# Patient Record
Sex: Female | Born: 1975 | Race: White | Hispanic: No | Marital: Married
Health system: Midwestern US, Community
[De-identification: ages and names within clinical notes are randomized; demographics above are authoritative.]

## PROBLEM LIST (undated history)

## (undated) ENCOUNTER — Inpatient Hospital Stay: Payer: Self-pay

## (undated) DIAGNOSIS — R112 Nausea with vomiting, unspecified: Secondary | ICD-10-CM

## (undated) DIAGNOSIS — R51 Headache: Secondary | ICD-10-CM

## (undated) DIAGNOSIS — Z9889 Other specified postprocedural states: Secondary | ICD-10-CM

## (undated) DIAGNOSIS — N200 Calculus of kidney: Secondary | ICD-10-CM

## (undated) DIAGNOSIS — R519 Headache, unspecified: Secondary | ICD-10-CM

## (undated) DIAGNOSIS — T753XXA Motion sickness, initial encounter: Secondary | ICD-10-CM

## (undated) HISTORY — PX: GALLBLADDER SURGERY: SHX652

## (undated) HISTORY — PX: WISDOM TOOTH EXTRACTION: SHX21

## (undated) HISTORY — DX: Calculus of kidney: N20.0

## (undated) HISTORY — PX: OTHER SURGICAL HISTORY: SHX169

---

## 2005-09-19 ENCOUNTER — Emergency Department: Payer: Self-pay | Admitting: Emergency Medicine

## 2007-12-17 HISTORY — PX: CHOLECYSTECTOMY: SHX55

## 2008-01-31 ENCOUNTER — Inpatient Hospital Stay: Payer: Self-pay | Admitting: Obstetrics & Gynecology

## 2008-02-04 ENCOUNTER — Ambulatory Visit: Payer: Self-pay | Admitting: Pediatrics

## 2008-05-17 ENCOUNTER — Ambulatory Visit: Payer: Self-pay

## 2008-05-17 ENCOUNTER — Inpatient Hospital Stay: Payer: Self-pay | Admitting: General Surgery

## 2009-09-15 ENCOUNTER — Inpatient Hospital Stay: Payer: Self-pay | Admitting: Unknown Physician Specialty

## 2011-08-16 ENCOUNTER — Observation Stay: Payer: Self-pay | Admitting: Obstetrics and Gynecology

## 2011-08-28 ENCOUNTER — Ambulatory Visit: Payer: Self-pay

## 2011-10-20 ENCOUNTER — Inpatient Hospital Stay: Payer: Self-pay

## 2011-12-17 HISTORY — PX: LITHOTRIPSY: SUR834

## 2012-01-22 ENCOUNTER — Ambulatory Visit: Payer: Self-pay | Admitting: Urology

## 2012-01-22 LAB — HCG, QUANTITATIVE, PREGNANCY: Beta Hcg, Quant.: <1 m[IU]/mL — ABNORMAL LOW

## 2012-02-13 ENCOUNTER — Ambulatory Visit: Payer: Self-pay | Admitting: Urology

## 2012-02-13 LAB — HCG, QUANTITATIVE, PREGNANCY: Beta Hcg, Quant.: <1 m[IU]/mL — ABNORMAL LOW

## 2012-02-24 ENCOUNTER — Ambulatory Visit: Payer: Self-pay | Admitting: Urology

## 2013-01-09 ENCOUNTER — Emergency Department: Payer: Self-pay | Admitting: Emergency Medicine

## 2013-01-09 LAB — URINALYSIS, COMPLETE
Bacteria: NONE SEEN
Bilirubin,UR: NEGATIVE
Blood: NEGATIVE
Glucose,UR: NEGATIVE mg/dL (ref 0–75)
Leukocyte Esterase: NEGATIVE
Nitrite: NEGATIVE
Ph: 6 (ref 4.5–8.0)
Protein: 30
RBC,UR: 1 /HPF (ref 0–5)
Squamous Epithelial: 1

## 2013-01-09 LAB — COMPREHENSIVE METABOLIC PANEL
Albumin: 3.4 g/dL (ref 3.4–5.0)
Alkaline Phosphatase: 80 U/L (ref 50–136)
BUN: 15 mg/dL (ref 7–18)
Bilirubin,Total: 0.9 mg/dL (ref 0.2–1.0)
Calcium, Total: 8.4 mg/dL — ABNORMAL LOW (ref 8.5–10.1)
Co2: 20 mmol/L — ABNORMAL LOW (ref 21–32)
Creatinine: 0.55 mg/dL — ABNORMAL LOW (ref 0.60–1.30)
EGFR (African American): >60
EGFR (Non-African Amer.): >60
Glucose: 112 mg/dL — ABNORMAL HIGH (ref 65–99)
Potassium: 3.5 mmol/L (ref 3.5–5.1)
SGOT(AST): 24 U/L (ref 15–37)
SGPT (ALT): 31 U/L (ref 12–78)
Total Protein: 7.5 g/dL (ref 6.4–8.2)

## 2013-01-09 LAB — CBC
HCT: 45.5 % (ref 35.0–47.0)
HGB: 16.1 g/dL — ABNORMAL HIGH (ref 12.0–16.0)
RDW: 12.1 % (ref 11.5–14.5)

## 2013-08-05 ENCOUNTER — Observation Stay: Payer: Self-pay | Admitting: Obstetrics and Gynecology

## 2013-08-06 ENCOUNTER — Inpatient Hospital Stay: Payer: Self-pay

## 2013-08-06 LAB — CBC WITH DIFFERENTIAL/PLATELET
Basophil #: 0.1 10*3/uL (ref 0.0–0.1)
Basophil %: 0.7 %
Eosinophil #: 0.2 10*3/uL (ref 0.0–0.7)
Eosinophil %: 1.5 %
HCT: 37.3 % (ref 35.0–47.0)
HGB: 13.3 g/dL (ref 12.0–16.0)
Lymphocyte #: 2.4 10*3/uL (ref 1.0–3.6)
Lymphocyte %: 16.3 %
MCH: 33.1 pg (ref 26.0–34.0)
MCHC: 35.7 g/dL (ref 32.0–36.0)
Monocyte #: 0.8 x10 3/mm (ref 0.2–0.9)
Monocyte %: 5.5 %
Neutrophil #: 11.4 10*3/uL — ABNORMAL HIGH (ref 1.4–6.5)
Neutrophil %: 76 %
Platelet: 256 10*3/uL (ref 150–440)
WBC: 15 10*3/uL — ABNORMAL HIGH (ref 3.6–11.0)

## 2014-06-09 ENCOUNTER — Ambulatory Visit: Payer: Self-pay | Admitting: Family Medicine

## 2014-10-26 LAB — OB RESULTS CONSOLE GC/CHLAMYDIA
CHLAMYDIA, DNA PROBE: NEGATIVE
GC PROBE AMP, GENITAL: NEGATIVE

## 2014-11-16 ENCOUNTER — Emergency Department: Payer: Self-pay | Admitting: Emergency Medicine

## 2014-11-25 LAB — OB RESULTS CONSOLE ABO/RH: RH TYPE: NEGATIVE

## 2014-11-25 LAB — OB RESULTS CONSOLE HEPATITIS B SURFACE ANTIGEN: Hepatitis B Surface Ag: NEGATIVE

## 2014-11-25 LAB — OB RESULTS CONSOLE PLATELET COUNT: Platelets: 289 10*3/uL

## 2014-11-25 LAB — OB RESULTS CONSOLE HGB/HCT, BLOOD
HCT: 40 %
Hemoglobin: 13.9 g/dL

## 2014-11-25 LAB — OB RESULTS CONSOLE RUBELLA ANTIBODY, IGM: Rubella: IMMUNE

## 2014-11-25 LAB — OB RESULTS CONSOLE VARICELLA ZOSTER ANTIBODY, IGG: Varicella: IMMUNE

## 2014-11-25 LAB — OB RESULTS CONSOLE RPR: RPR: NONREACTIVE

## 2014-11-25 LAB — OB RESULTS CONSOLE HIV ANTIBODY (ROUTINE TESTING): HIV: NONREACTIVE

## 2014-11-25 LAB — OB RESULTS CONSOLE ANTIBODY SCREEN: ANTIBODY SCREEN: NEGATIVE

## 2014-12-14 ENCOUNTER — Encounter: Payer: Self-pay | Admitting: General Surgery

## 2014-12-21 ENCOUNTER — Ambulatory Visit (INDEPENDENT_AMBULATORY_CARE_PROVIDER_SITE_OTHER): Payer: BC Managed Care – PPO | Admitting: General Surgery

## 2014-12-21 ENCOUNTER — Encounter: Payer: Self-pay | Admitting: General Surgery

## 2014-12-21 VITALS — BP 120/78 | HR 86 | Resp 12 | Ht 68.0 in | Wt 146.0 lb

## 2014-12-21 DIAGNOSIS — S2020XA Contusion of thorax, unspecified, initial encounter: Secondary | ICD-10-CM

## 2014-12-21 DIAGNOSIS — S20219A Contusion of unspecified front wall of thorax, initial encounter: Secondary | ICD-10-CM
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
## 2014-12-21 NOTE — Patient Instructions (Addendum)
Advised to use a heating pad for comfort. The patient is aware to call back for any questions or concerns.

## 2014-12-22 DIAGNOSIS — S20219A Contusion of unspecified front wall of thorax, initial encounter: Secondary | ICD-10-CM | POA: Insufficient documentation

## 2014-12-29 ENCOUNTER — Ambulatory Visit: Payer: Self-pay | Admitting: General Surgery

## 2015-02-27 LAB — OB RESULTS CONSOLE HIV ANTIBODY (ROUTINE TESTING): HIV: NONREACTIVE

## 2015-02-27 LAB — OB RESULTS CONSOLE RPR: RPR: NONREACTIVE

## 2015-04-15 ENCOUNTER — Encounter: Payer: Self-pay | Admitting: Obstetrics and Gynecology

## 2015-04-15 ENCOUNTER — Inpatient Hospital Stay
Admission: EM | Admit: 2015-04-15 | Discharge: 2015-04-16 | DRG: 782 | Disposition: A | Discharge status: Home / Self Care | Payer: BC Managed Care – PPO | Attending: Obstetrics & Gynecology | Admitting: Obstetrics & Gynecology

## 2015-04-15 DIAGNOSIS — O4453 Low lying placenta with hemorrhage, third trimester: Secondary | ICD-10-CM | POA: Diagnosis present

## 2015-04-15 DIAGNOSIS — O4413 Placenta previa with hemorrhage, third trimester: Principal | ICD-10-CM | POA: Diagnosis present

## 2015-04-15 DIAGNOSIS — Z3A35 35 weeks gestation of pregnancy: Secondary | ICD-10-CM | POA: Diagnosis present

## 2015-04-15 LAB — FIBRINOGEN: Fibrinogen: 407 mg/dL (ref 210–470)

## 2015-04-15 LAB — CBC WITH DIFFERENTIAL/PLATELET
Basophil #: 0.1 10*3/uL (ref 0.0–0.1)
Basophil %: 0.4 %
EOS ABS: 0.1 10*3/uL (ref 0.0–0.7)
EOS PCT: 0.7 %
HCT: 32.9 % — ABNORMAL LOW (ref 35.0–47.0)
HGB: 11.8 g/dL — ABNORMAL LOW (ref 12.0–16.0)
Lymphocyte #: 2.2 10*3/uL (ref 1.0–3.6)
Lymphocyte %: 11.8 %
MCH: 32.9 pg (ref 26.0–34.0)
MCHC: 35.8 g/dL (ref 32.0–36.0)
MCV: 92 fL (ref 80–100)
Monocyte #: 0.7 x10 3/mm (ref 0.2–0.9)
Monocyte %: 3.8 %
NEUTROS ABS: 15.4 10*3/uL — AB (ref 1.4–6.5)
Neutrophil %: 83.3 %
PLATELETS: 251 10*3/uL (ref 150–440)
RBC: 3.58 10*6/uL — AB (ref 3.80–5.20)
RDW: 12.5 % (ref 11.5–14.5)
WBC: 18.5 10*3/uL — ABNORMAL HIGH (ref 3.6–11.0)

## 2015-04-15 LAB — COMPREHENSIVE METABOLIC PANEL
ALBUMIN: 2.7 g/dL — AB
ALT: 14 U/L
AST: 22 U/L
Alkaline Phosphatase: 89 U/L
Anion Gap: 7 (ref 7–16)
BUN: 9 mg/dL
Bilirubin,Total: 0.4 mg/dL
CALCIUM: 8.7 mg/dL — AB
Chloride: 105 mmol/L
Co2: 24 mmol/L
Creatinine: 0.66 mg/dL
Glucose: 97 mg/dL
Potassium: 3.4 mmol/L — ABNORMAL LOW
SODIUM: 136 mmol/L
TOTAL PROTEIN: 5.9 g/dL — AB

## 2015-04-15 LAB — PROTIME-INR
INR: 1
Prothrombin Time: 13.5 secs

## 2015-04-15 LAB — APTT: Activated PTT: 32.9 secs (ref 23.6–35.9)

## 2015-04-15 MED ORDER — BETAMETHASONE SOD PHOS & ACET 6 (3-3) MG/ML IJ SUSP
12.5000 mg | INTRAMUSCULAR | Status: AC
Start: 2015-04-16 — End: 2015-04-16
  Administered 2015-04-16: 12.5 mg via INTRAMUSCULAR

## 2015-04-15 MED ORDER — OXYTOCIN BOLUS FROM INFUSION
250.0000 mL | Freq: Once | INTRAVENOUS | Status: DC
Start: 2015-04-16 — End: 2015-04-16
  Filled 2015-04-15: qty 500

## 2015-04-15 MED ORDER — LACTATED RINGERS IV BOLUS (SEPSIS)
250.0000 mL | INTRAVENOUS | Status: DC | PRN
  Filled 2015-04-15: qty 250

## 2015-04-15 MED ORDER — LACTATED RINGERS IV BOLUS (SEPSIS)
INTRAVENOUS | Status: DC
  Administered 2015-04-15: 20:00:00 via INTRAVENOUS
  Filled 2015-04-15: qty 250

## 2015-04-15 MED ORDER — OXYTOCIN 40 UNITS IN LACTATED RINGERS INFUSION - SIMPLE MED: 75.0000 mL/h | INTRAVENOUS | Status: DC

## 2015-04-15 MED ORDER — LACTATED RINGERS IV SOLN
INTRAVENOUS | Status: DC | PRN
  Filled 2015-04-15: qty 1000

## 2015-04-15 MED ORDER — CALCIUM CARBONATE ANTACID 500 MG PO CHEW: 1.0000 | CHEWABLE_TABLET | Freq: Three times a day (TID) | ORAL | Status: DC | PRN

## 2015-04-16 ENCOUNTER — Encounter: Payer: Self-pay | Admitting: Anesthesiology

## 2015-04-16 ENCOUNTER — Other Ambulatory Visit: Payer: Self-pay | Admitting: Obstetrics and Gynecology

## 2015-04-16 MED ORDER — BETAMETHASONE SOD PHOS & ACET 6 (3-3) MG/ML IJ SUSP
INTRAMUSCULAR | Status: AC
  Filled 2015-04-16: qty 1

## 2015-04-16 NOTE — Discharge Instructions (Signed)
°  Vaginal Bleeding During Pregnancy, Third Trimester °A small amount of bleeding (spotting) from the vagina is relatively common in pregnancy. Various things can cause bleeding or spotting in pregnancy. Sometimes the bleeding is normal and is not a problem. However, bleeding during the third trimester can also be a sign of something serious for the mother and the baby. Be sure to tell your health care provider about any vaginal bleeding right away.  °Some possible causes of vaginal bleeding during the third trimester include:  °· The placenta may be partially or completely covering the opening to the cervix (placenta previa).   °· The placenta may have separated from the uterus (abruption of the placenta).   °· There may be an infection or growth on the cervix.   °· You may be starting labor, called discharging of the mucus plug.   °· The placenta may grow into the muscle layer of the uterus (placenta accreta).   °HOME CARE INSTRUCTIONS  °Watch your condition for any changes. The following actions may help to lessen any discomfort you are feeling:  °· Follow your health care provider's instructions for limiting your activity. If your health care provider orders bed rest, you may need to stay in bed and only get up to use the bathroom. However, your health care provider may allow you to continue light activity. °· If needed, make plans for someone to help with your regular activities and responsibilities while you are on bed rest. °· Keep track of the number of pads you use each day, how often you change pads, and how soaked (saturated) they are. Write this down. °· Do not use tampons. Do not douche. °· Do not have sexual intercourse or orgasms until approved by your health care provider. °· Follow your health care provider's advice about lifting, driving, and physical activities. °· If you pass any tissue from your vagina, save the tissue so you can show it to your health care provider.   °· Only take  over-the-counter or prescription medicines as directed by your health care provider. °· Do not take aspirin because it can make you bleed.   °· Keep all follow-up appointments as directed by your health care provider. °SEEK MEDICAL CARE IF: °· You have any vaginal bleeding during any part of your pregnancy. °· You have cramps or labor pains. °· You have a fever, not controlled by medicine. °SEEK IMMEDIATE MEDICAL CARE IF:  °· You have severe cramps or pain in your back or belly (abdomen). °· You have chills. °· You have a gush of fluid from the vagina. °· You pass large clots or tissue from your vagina. °· Your bleeding increases. °· You feel light-headed or weak. °· You pass out. °· You feel less movement or no movement of the baby.   °MAKE SURE YOU: °· Understand these instructions. °· Will watch your condition. °· Will get help right away if you are not doing well or get worse. °Document Released: 02/22/2003 Document Revised: 12/07/2013 Document Reviewed: 08/09/2013 °ExitCare® Patient Information ©2015 [employer]. This information is not intended to replace advice given to you by your health care provider. Make sure you discuss any questions you have with your health care provider. ° ° °

## 2015-04-16 NOTE — Discharge Summary (Signed)
Physician Discharge Summary  Patient ID: Cathy CrockMegan Owens MRN: [id number] DOB/AGE: 37/07/1976 37 y.o.  Admit date: 04/15/2015 Discharge date: 04/16/2015  Admission Diagnoses:  Discharge Diagnoses:  Principal Problem:   Low lying placenta with hemorrhage in third trimester, antepartum   Discharged Condition: good  Hospital Course: Patient had episode of bleeding at home, with known low lying placenta, now at 35 weeks.  Admitted for observation, with some spotting but no further active bleeding.  Early contractions on monitor but minimally felt and none on monitor for last several hours.  Stayed overnight with no further bleeding.  Fetal well being reassuring.  Inpatient and outpatient monitoring and surveillance discussed. Plan outpt management with follow up/ ultrasound this week.  Consults: None  Significant Diagnostic Studies:  Results for orders placed or performed during the hospital encounter of 04/15/15 (from the past 48 hour(s))  CBC with Differential/Platelet     Status: Abnormal   Collection Time: 04/15/15  8:04 AM  Result Value Ref Range   WBC 18.5 (H) 3.6-11.0 x10 3/mm 3   RBC 3.58 (L) 3.80-5.20 X10 6/mm 3   HGB 11.8 (L) 12.0-16.0 g/dL   HCT 21.332.9 (L) 08.6-57.835.0-47.0 %   MCV 92 80-100 fL   MCH 32.9 26.0-34.0 pg   MCHC 35.8 32.0-36.0 g/dL   RDW 46.912.5 62.9-52.811.5-14.5 %   Platelet 251 150-440 x10 3/mm 3   Neutrophil % 83.3 %   Lymphocyte % 11.8 %   Monocyte % 3.8 %   Eosinophil % 0.7 %   Basophil % 0.4 %   Neutrophil # 15.4 (H) 1.4-6.5 x10 3/mm 3   Lymphocyte # 2.2 1.0-3.6 x10 3/mm 3   Monocyte # 0.7 0.2-0.9 x10 3/mm    Eosinophil # 0.1 0.0-0.7 x10 3/mm 3   Basophil # 0.1 0.0-0.1 x10 3/mm 3  Fibrinogen     Status: None   Collection Time: 04/15/15  8:04 AM  Result Value Ref Range   Fibrinogen 407 210-470 mg/dL  APTT     Status: None   Collection Time: 04/15/15  8:04 AM  Result Value Ref Range   Activated PTT 32.9 23.6-35.9 secs  Protime-INR     Status: None   Collection Time:  04/15/15  8:04 AM  Result Value Ref Range   Prothrombin Time 13.5 secs   INR 1.0     Discharge Exam: Blood pressure 106/60, pulse 94, temperature 98.2 F (36.8 C), temperature source Oral, resp. rate 20, last menstrual period 08/12/2014. General appearance: alert and cooperative GI: gravid, NT FHT 140s, accels, no decels; no ctxs   Disposition: home     Medication List    TAKE these medications        docusate sodium 100 MG capsule  Commonly known as:  COLACE  Take 100 mg by mouth daily as needed for mild constipation.     hydrocortisone-pramoxine 2.5-1 % rectal cream  Commonly known as:  ANALPRAM-HC  Place 1 application rectally 3 (three) times daily.     ibuprofen 100 MG chewable tablet  Commonly known as:  ADVIL,MOTRIN  Chew 600 mg by mouth every 6 (six) hours as needed for fever or mild pain (As needed, pain, temp. great than 100.4 or inflammation).     NON FORMULARY  - Hydrocortisone-pramoxine 1%-1% topical foam  - 1 application topically 3 to 4 times a day, as needed.     oxyCODONE-acetaminophen 5-325 MG per tablet  Commonly known as:  PERCOCET/ROXICET  Take 1 tablet by mouth every 4 (four) hours as  needed for severe pain.     prenatal multivitamin Tabs tablet  Take 1 tablet by mouth daily at 12 noon. Prenatal Multivitamins with Folic Acid      prenatal vitamin w/FE, FA 27-1 MG Tabs tablet  Take 1 tablet by mouth daily at 12 noon.           Follow-up Information    Follow up with Letitia Libra, MD In 3 days.   Specialty:  Obstetrics and Gynecology   Contact information:   [address] [place] [address] [phone]       Signed: Letitia Libra, MD 04/16/2015, 9:27 AM

## 2015-04-16 NOTE — Anesthesia Preprocedure Evaluation (Deleted)
Anesthesia Evaluation  Patient identified by MRN, date of birth, ID band Patient awake    Reviewed: Allergy & Precautions, NPO status , Patient's Chart, lab work & pertinent test results  Airway Mallampati: II  TM Distance: >3 FB Neck ROM: Full    Dental   Pulmonary          Cardiovascular Rhythm:Regular Rate:Normal     Neuro/Psych    GI/Hepatic   Endo/Other    Renal/GU      Musculoskeletal   Abdominal   Peds  Hematology   Anesthesia Other Findings   Reproductive/Obstetrics                             Anesthesia Physical Anesthesia Plan  ASA: II  Anesthesia Plan: Epidural   Post-op Pain Management:    Induction:   Airway Management Planned:   Additional Equipment:   Intra-op Plan:   Post-operative Plan:   Informed Consent: I have reviewed the patients History and Physical, chart, labs and discussed the procedure including the risks, benefits and alternatives for the proposed anesthesia with the patient or authorized representative who has indicated his/her understanding and acceptance.     Plan Discussed with:   Anesthesia Plan Comments:         Anesthesia Quick Evaluation

## 2015-04-21 ENCOUNTER — Other Ambulatory Visit: Payer: Self-pay | Admitting: Obstetrics and Gynecology

## 2015-04-21 DIAGNOSIS — O4400 Placenta previa specified as without hemorrhage, unspecified trimester: Secondary | ICD-10-CM

## 2015-04-25 NOTE — H&P (Signed)
L&D Evaluation:  History Expanded:  HPI 39 yo G5P4004 at 6158w1d gestational age by LMP consistent with 11 week ultrasound.  Pregnancy complicated by low-lying placenta and advanced maternal age.  She presents for new-onset bleeding that started at about 4 o'clock this morning. The bleeding is described as heavy and she feels occasional contractions.  She had an ultrasound about two weeks ago and it revealed that the placental tip was about 0.5cm from the internal os.  She was scheduled to have a repeat ultrasound this Thursday.  She notes no leakage of fluid, positive fetal movement.  She feels occasional contractions.   Gravida 5   Term 4   PreTerm 0   Abortion 0   Living 4   Blood Type (Maternal) A negative   Group B Strep Results Maternal (Result >5wks must be treated as unknown) negative  unknown/result > 5 weeks ago   Maternal HIV Negative   Maternal Syphilis Ab Nonreactive   Maternal Varicella Immune   Rubella Results (Maternal) immune   Maternal T-Dap Immune   [facility]   Presents with vaginal bleeding   Patient's Medical History migraine, urinary calculus   Patient's Surgical History cholecystectomy, hand surgery, wisdome teeth extraction   Medications Pre Natal Vitamins   Allergies PCN, Sulfa   Social History none   Family History Non-Contributory   Exam:  Vital Signs stable  afebrile, normotensive, all others normal   General no apparent distress   Mental Status clear   Chest clear   Heart normal sinus rhythm   Abdomen gravid, non-tender   Estimated Fetal Weight Large for gestational age   Back no CVAT   Edema no edema   Pelvic red blood on perineum, vaginal vault with minimal red blood. Cervix with no active bleeding.  Cvx 1/25/-4.   Mebranes Intact   FHT normal rate with no decels   FHT Description 140/mod var/+accels/no decels   Ucx regular, 3-4 q 10 min   Skin no lesions   Impression:  Impression 1) Intrauterine  pregnancy at 1858w1d gestational age, 2) elderly multigravida, 3) vaginal bleeding, 4) low lying placenta   Plan:  Comments 1) Labor with low-lying placenta: will monitor closely as the bleeding appears to have slowed or stopped.  Will not administer tocolytics. Will monitor closely as she is contracting.  Unable to perform vaginal-approach ultrasound to assess distance of placenta from internal os.  If able to get stable, may repeat ultrasound.  Discussed that if she requires urgent or emergent cesarean delivery, risk of hemorrhage is high and risk of need for cesarean hysterectomy is elevated.  Ordered basic labs, plus fibrinogin, aptt, pt, cmp, kleihauer-betke.    2) Fetus - category I tracing.  Per recent ALPS trial data and ACOG Practice Advisory (March 20, 2015), will administer BMTZ. Discussed with patient and husband.  3) PNL A negative / ABSC negative / RI / VZI / HIV neg / RPR NR / HBsAg neg / NIFT negative / 1-hr OGTT 117 / GBS unknown / total weight gain this pregnancy = 28lb     - s/p rhogam at 02/27/15  4) TDAP given 03/30/15  5) Disposition - pending outcome of bleeding/labor.   Electronic Signatures: Conard NovakJackson, Stephen D (MD)  (Signed 30-Apr-16 07:57)  Authored: L&D Evaluation   Last Updated: 30-Apr-16 07:57 by Conard NovakJackson, Stephen D (MD)

## 2015-04-28 ENCOUNTER — Inpatient Hospital Stay
Admission: EM | Admit: 2015-04-28 | Discharge: 2015-05-02 | DRG: 765 | Disposition: A | Discharge status: Home / Self Care | Payer: BC Managed Care – PPO | Attending: Obstetrics and Gynecology | Admitting: Obstetrics and Gynecology

## 2015-04-28 ENCOUNTER — Inpatient Hospital Stay: Payer: BC Managed Care – PPO

## 2015-04-28 DIAGNOSIS — O09523 Supervision of elderly multigravida, third trimester: Secondary | ICD-10-CM | POA: Diagnosis not present

## 2015-04-28 DIAGNOSIS — O4413 Placenta previa with hemorrhage, third trimester: Principal | ICD-10-CM | POA: Diagnosis present

## 2015-04-28 DIAGNOSIS — D62 Acute posthemorrhagic anemia: Secondary | ICD-10-CM | POA: Diagnosis not present

## 2015-04-28 DIAGNOSIS — O4453 Low lying placenta with hemorrhage, third trimester: Secondary | ICD-10-CM

## 2015-04-28 DIAGNOSIS — J029 Acute pharyngitis, unspecified: Secondary | ICD-10-CM | POA: Diagnosis not present

## 2015-04-28 DIAGNOSIS — Z3A37 37 weeks gestation of pregnancy: Secondary | ICD-10-CM | POA: Diagnosis present

## 2015-04-28 DIAGNOSIS — O4690 Antepartum hemorrhage, unspecified, unspecified trimester: Secondary | ICD-10-CM | POA: Diagnosis present

## 2015-04-28 DIAGNOSIS — O4420 Partial placenta previa NOS or without hemorrhage, unspecified trimester: Secondary | ICD-10-CM | POA: Diagnosis present

## 2015-04-28 DIAGNOSIS — Z98891 History of uterine scar from previous surgery: Secondary | ICD-10-CM

## 2015-04-28 LAB — CBC
HCT: 35 % (ref 35.0–47.0)
Hemoglobin: 12.2 g/dL (ref 12.0–16.0)
MCH: 32.8 pg (ref 26.0–34.0)
MCHC: 34.7 g/dL (ref 32.0–36.0)
MCV: 94.3 fL (ref 80.0–100.0)
PLATELETS: 280 10*3/uL (ref 150–440)
RBC: 3.71 MIL/uL — ABNORMAL LOW (ref 3.80–5.20)
RDW: 13.1 % (ref 11.5–14.5)
WBC: 17.1 10*3/uL — AB (ref 3.6–11.0)

## 2015-04-28 LAB — DIFFERENTIAL
BASOS ABS: 0.1 10*3/uL (ref 0–0.1)
Basophils Relative: 1 %
Eosinophils Absolute: 0.2 10*3/uL (ref 0–0.7)
Eosinophils Relative: 1 %
LYMPHS ABS: 2.4 10*3/uL (ref 1.0–3.6)
LYMPHS PCT: 14 %
MONO ABS: 0.9 10*3/uL (ref 0.2–0.9)
MONOS PCT: 5 %
NEUTROS ABS: 13.5 10*3/uL — AB (ref 1.4–6.5)
Neutrophils Relative %: 79 %

## 2015-04-28 LAB — ABO/RH: ABO/RH(D): A NEG

## 2015-04-28 MED ORDER — DEXTROSE IN LACTATED RINGERS 5 % IV SOLN
INTRAVENOUS | Status: DC
  Filled 2015-04-28 (×4): qty 1000

## 2015-04-28 MED ORDER — SODIUM CHLORIDE 0.9 % IJ SOLN
INTRAMUSCULAR | Status: AC
  Administered 2015-04-28: 3 mL
  Filled 2015-04-28: qty 3

## 2015-04-28 NOTE — OB Triage Note (Addendum)
Asked to check on patient with Dr. Jean RosenthalJackson. Patient requesting something to eat or drink as she is hungry and getting a headache. Dr. Jean RosenthalJackson D/W patient remain NPO until blood tests are back to R/O abruption vs. LLP bleeding. Order to start IV with D5LR and draw blood. Dr. Jean RosenthalJackson will reevaluate after C/S. Patient agrees with plan. Loyola MastKaren R Saisha Hogue, RN

## 2015-04-28 NOTE — OB Triage Note (Signed)
Pt. presents this evening with reports of irregular abdominal tightening. States she has a low-lying placenta and has noticed bright red blood in toilet, while wiping, and accumulating in a pad. Observed scant brownish discharge yesterday morning.

## 2015-04-28 NOTE — Plan of Care (Addendum)
Dr. Jean RosenthalJackson in to see patient. Discuss lab results and to continue to keep patient for observation. Patient ok with plan, may have clear liquid. She would like to wash her hair with husband's help. Supplies given. Loyola MastKaren R Shandy Vi, RN

## 2015-04-28 NOTE — Plan of Care (Deleted)
Second dose of Cytotec placed in right cheek. Patient continues to rate pain at 6. Closing eyes, breathing and can not talk through the contraction, but relaxes and talks or doses between contractions. Loyola MastKaren R Lidiya Reise, RN

## 2015-04-28 NOTE — OB Triage Note (Addendum)
39yo [id number] @ 37.0 with low lying placenta and history of admission for heavy bleed on 4/30 presents today with bleeding.  She woke from sleep after changing position and felt discharge, noticing it was blood, no more than a period. She did not notice active bleeding.  She noted it more on the toilet paper than on a pad.  Bright red.  No contractions, + fetal movement, no LOF.   Was scheduled for an appointment today, and an US with [facility] next Thursday.   FHT 140 mod +accels no decels TOCO: rare ctx  BP 118/80 mmHg  Pulse 95  Temp(Src) 98.5 F (36.9 C) (Oral)  Resp 16  LMP 08/12/2014  Plan: - Sterile speculum exam to eval bleeding/dilation - ultrasound to eval placenta - SVE pending result of US - if bleeding persists or increases, will consider delivery but route TBD pending US eval. Discussed possibilities with patient and husband, they understand decision making process. - Rh negative sp Rhogam on 02/27/15  Ranae Plumberhelsea Aleksa Collinsworth, MD

## 2015-04-28 NOTE — H&P (Signed)
L&D Evaluation:  History Expanded:  HPI 39 yo G5P4004 at 4157w0d gestational age by LMP consistent with 11 week ultrasound.  Pregnancy complicated by low-lying placenta and advanced maternal age.  She presented this AM with her second episode of vaginal bleeding, this time much less than a menses for her.  She had an ultrasound today that showed the placental tip was about 1cm from the internal os.  She notes no leakage of fluid, positive fetal movement.  She feels no contractions.   Gravida 5   Term 4   PreTerm 0   Abortion 0   Living 4   Blood Type (Maternal) A negative   Group B Strep Results Maternal (Result >5wks must be treated as unknown) negative  unknown/result > 5 weeks ago   Maternal HIV Negative   Maternal Syphilis Ab Nonreactive   Maternal Varicella Immune   Rubella Results (Maternal) immune   Maternal T-Dap Immune   EDC 19-May-2015   Presents with vaginal bleeding   Patient's Medical History migraine, urinary calculus   Patient's Surgical History cholecystectomy, hand surgery, wisdome teeth extraction   Medications Pre Natal Vitamins   Allergies PCN, Sulfa   Social History none   Family History Non-Contributory   Exam:  Vital Signs Filed Vitals:   04/28/15 1535  BP: 116/75  Pulse: 90  Temp:   Resp:       General no apparent distress   Mental Status clear   Chest clear   Heart normal sinus rhythm   Abdomen gravid, non-tender   Estimated Fetal Weight Large for gestational age   Back no CVAT   Edema no edema   Pelvic Examined at presentation by Dr. Elesa MassedWard. Small clot noted in vaginal vault, no active bleeding.   Mebranes Intact   FHT normal rate with no decels   FHT Description FHT 140 mod +accels no decels TOCO: rare ctx      Skin no lesions   Impression:  Impression 1) Intrauterine pregnancy at 7257w0d gestational age, 2) elderly multigravida, 3) vaginal bleeding, 4) low lying placenta   Plan:  Comments 1) Labor with  low-lying placenta: This is her second bleed. She has been monitored all day on labor an delivery and is stable from a bleeding standpoint.  I spoke extensively with the patient and her husband regarding recommendation for delivery by cesarean section given the location of her placenta and two episodes of bleeding.  Discussed risk of postpartum hemorrhage. Discussed possible risk of placenta accreta in this situation and risk of need for hysterectomy at the time of delivery.  She and her husband voiced understanding and agreement with this plan.  Will plan for cesarean delivery for 7am tomorrow morning.  OR is aware and she has been posted.  Will order type and hold of 4 units pRBCs given her high risk of postpartum hemorrhage (multiparity, marginal placenta, risk of accreta)   2) Fetus - category I tracing.  S/p betamethasone at 35 weeks.   3) PNL A negative / ABSC negative / RI / VZI / HIV neg / RPR NR / HBsAg neg / NIFT negative / 1-hr OGTT 117 / GBS unknown / total weight gain this pregnancy = 28lb     - s/p rhogam at 02/27/15  4) TDAP given 03/30/15  5) Disposition - home postpartum.   Electronic Signatures: Conard NovakJackson, Aracelis Ulrey D (MD)  (Signed 30-Apr-16 07:57)  Authored: L&D Evaluation   Last Updated: 30-Apr-16 07:57 by Conard NovakJackson, Nanami Whitelaw D (MD)

## 2015-04-29 ENCOUNTER — Encounter: Payer: Self-pay | Admitting: *Deleted

## 2015-04-29 ENCOUNTER — Inpatient Hospital Stay: Payer: BC Managed Care – PPO | Admitting: Registered Nurse

## 2015-04-29 ENCOUNTER — Encounter
Admission: EM | Disposition: A | Discharge status: Home / Self Care | Payer: Self-pay | Source: Home / Self Care | Attending: Obstetrics and Gynecology

## 2015-04-29 DIAGNOSIS — Z98891 History of uterine scar from previous surgery: Secondary | ICD-10-CM

## 2015-04-29 LAB — KLEIHAUER-BETKE STAIN
# Vials RhIg: 1
FETAL CELLS %: 0 %
QUANTITATION FETAL HEMOGLOBIN: 0 mL

## 2015-04-29 LAB — PREPARE RBC (CROSSMATCH)

## 2015-04-29 LAB — RPR: RPR Ser Ql: NONREACTIVE

## 2015-04-29 SURGERY — Surgical Case
Anesthesia: General

## 2015-04-29 MED ORDER — SUCCINYLCHOLINE CHLORIDE 20 MG/ML IJ SOLN
INTRAMUSCULAR | Status: DC | PRN
  Administered 2015-04-29: 100 mg via INTRAVENOUS

## 2015-04-29 MED ORDER — AMMONIA AROMATIC IN INHA
RESPIRATORY_TRACT | Status: AC
  Filled 2015-04-29: qty 10

## 2015-04-29 MED ORDER — ROCURONIUM BROMIDE 100 MG/10ML IV SOLN
INTRAVENOUS | Status: DC | PRN
  Administered 2015-04-29: 10 mg via INTRAVENOUS
  Administered 2015-04-29: 20 mg via INTRAVENOUS

## 2015-04-29 MED ORDER — ZOLPIDEM TARTRATE 5 MG PO TABS: 5.0000 mg | ORAL_TABLET | Freq: Every evening | ORAL | Status: DC | PRN

## 2015-04-29 MED ORDER — DIBUCAINE 1 % RE OINT: 1.0000 "application " | TOPICAL_OINTMENT | RECTAL | Status: DC | PRN

## 2015-04-29 MED ORDER — NALOXONE HCL 0.4 MG/ML IJ SOLN: 0.4000 mg | INTRAMUSCULAR | Status: DC | PRN

## 2015-04-29 MED ORDER — CLINDAMYCIN PHOSPHATE 900 MG/50ML IV SOLN
INTRAVENOUS | Status: AC
  Administered 2015-04-29: 900 mg via INTRAVENOUS
  Filled 2015-04-29: qty 50

## 2015-04-29 MED ORDER — OXYTOCIN 40 UNITS IN LACTATED RINGERS INFUSION - SIMPLE MED
INTRAVENOUS | Status: DC | PRN
  Administered 2015-04-29: 40 mL via INTRAVENOUS

## 2015-04-29 MED ORDER — OXYTOCIN 40 UNITS IN LACTATED RINGERS INFUSION - SIMPLE MED
INTRAVENOUS | Status: AC
  Administered 2015-04-29: 40 [IU] via INTRAVENOUS
  Filled 2015-04-29: qty 1000

## 2015-04-29 MED ORDER — SENNOSIDES-DOCUSATE SODIUM 8.6-50 MG PO TABS
2.0000 | ORAL_TABLET | ORAL | Status: DC
  Administered 2015-04-29 – 2015-05-02 (×3): 2 via ORAL
  Filled 2015-04-29 (×3): qty 2

## 2015-04-29 MED ORDER — LIDOCAINE HCL (PF) 1 % IJ SOLN
INTRAMUSCULAR | Status: AC
  Filled 2015-04-29: qty 30

## 2015-04-29 MED ORDER — BUPIVACAINE HCL (PF) 0.5 % IJ SOLN
INTRAMUSCULAR | Status: AC
  Filled 2015-04-29: qty 30

## 2015-04-29 MED ORDER — DIPHENHYDRAMINE HCL 12.5 MG/5ML PO ELIX
12.5000 mg | ORAL_SOLUTION | Freq: Four times a day (QID) | ORAL | Status: DC | PRN
  Filled 2015-04-29: qty 5

## 2015-04-29 MED ORDER — PRENATAL MULTIVITAMIN CH
1.0000 | ORAL_TABLET | Freq: Every day | ORAL | Status: DC
  Administered 2015-04-30 – 2015-05-02 (×3): 1 via ORAL
  Filled 2015-04-29 (×3): qty 1

## 2015-04-29 MED ORDER — FENTANYL CITRATE (PF) 100 MCG/2ML IJ SOLN
INTRAMUSCULAR | Status: AC
  Administered 2015-04-29: 25 ug via INTRAVENOUS
  Filled 2015-04-29: qty 2

## 2015-04-29 MED ORDER — BUPIVACAINE ON-Q PAIN PUMP (FOR ORDER SET NO CHG)
INJECTION | Status: AC
  Filled 2015-04-29: qty 1

## 2015-04-29 MED ORDER — MISOPROSTOL 200 MCG PO TABS
ORAL_TABLET | ORAL | Status: AC
  Filled 2015-04-29: qty 4

## 2015-04-29 MED ORDER — ONDANSETRON HCL 4 MG/2ML IJ SOLN: 4.0000 mg | Freq: Four times a day (QID) | INTRAMUSCULAR | Status: DC | PRN

## 2015-04-29 MED ORDER — SODIUM CHLORIDE 0.9 % IV SOLN: Freq: Once | INTRAVENOUS | Status: DC

## 2015-04-29 MED ORDER — ONDANSETRON HCL 4 MG/2ML IJ SOLN
INTRAMUSCULAR | Status: DC | PRN
  Administered 2015-04-29: 4 mg via INTRAVENOUS

## 2015-04-29 MED ORDER — CITRIC ACID-SODIUM CITRATE 334-500 MG/5ML PO SOLN
30.0000 mL | Freq: Once | ORAL | Status: AC
  Administered 2015-04-29: 30 mL via ORAL

## 2015-04-29 MED ORDER — BUPIVACAINE HCL 0.5 % IJ SOLN
5.0000 mL | Freq: Once | INTRAMUSCULAR | Status: DC
  Filled 2015-04-29: qty 5

## 2015-04-29 MED ORDER — SODIUM CHLORIDE 0.9 % IJ SOLN: 9.0000 mL | INTRAMUSCULAR | Status: DC | PRN

## 2015-04-29 MED ORDER — OXYTOCIN 40 UNITS IN LACTATED RINGERS INFUSION - SIMPLE MED
62.5000 mL/h | INTRAVENOUS | Status: AC
  Administered 2015-04-29: 40 [IU] via INTRAVENOUS
  Filled 2015-04-29: qty 1000

## 2015-04-29 MED ORDER — FENTANYL CITRATE (PF) 100 MCG/2ML IJ SOLN
INTRAMUSCULAR | Status: AC
  Filled 2015-04-29: qty 2

## 2015-04-29 MED ORDER — FENTANYL CITRATE (PF) 100 MCG/2ML IJ SOLN
INTRAMUSCULAR | Status: DC | PRN
  Administered 2015-04-29: 150 ug via INTRAVENOUS
  Administered 2015-04-29: 100 ug via INTRAVENOUS
  Administered 2015-04-29: 100 ug

## 2015-04-29 MED ORDER — PROPOFOL 10 MG/ML IV BOLUS
INTRAVENOUS | Status: DC | PRN
  Administered 2015-04-29: 160 mg via INTRAVENOUS
  Administered 2015-04-29 (×2): 100 mg via INTRAVENOUS

## 2015-04-29 MED ORDER — LACTATED RINGERS IV BOLUS (SEPSIS)
1000.0000 mL | Freq: Once | INTRAVENOUS | Status: AC
  Administered 2015-04-29: 1000 mL via INTRAVENOUS

## 2015-04-29 MED ORDER — OXYTOCIN 10 UNIT/ML IJ SOLN
INTRAMUSCULAR | Status: AC
  Filled 2015-04-29: qty 2

## 2015-04-29 MED ORDER — WITCH HAZEL-GLYCERIN EX PADS: 1.0000 "application " | MEDICATED_PAD | CUTANEOUS | Status: DC | PRN

## 2015-04-29 MED ORDER — BUPIVACAINE HCL 0.5 % IJ SOLN
INTRAMUSCULAR | Status: DC | PRN
Start: 2015-04-29 — End: 2015-04-29
  Administered 2015-04-29: 10 mL

## 2015-04-29 MED ORDER — MORPHINE SULFATE (PF) 1 MG/ML IV SOLN
INTRAVENOUS | Status: DC
  Administered 2015-04-29: 14:00:00 via INTRAVENOUS
  Administered 2015-04-29: 1 mg via INTRAVENOUS
  Filled 2015-04-29 (×2): qty 25

## 2015-04-29 MED ORDER — BUPIVACAINE 0.25 % ON-Q PUMP DUAL CATH 400 ML
INJECTION | Status: AC
  Filled 2015-04-29: qty 400

## 2015-04-29 MED ORDER — ONDANSETRON HCL 4 MG/2ML IJ SOLN: 4.0000 mg | Freq: Once | INTRAMUSCULAR | Status: DC | PRN

## 2015-04-29 MED ORDER — LACTATED RINGERS IV SOLN
INTRAVENOUS | Status: DC
  Administered 2015-04-30: 11:00:00 via INTRAVENOUS

## 2015-04-29 MED ORDER — DIPHENHYDRAMINE HCL 50 MG/ML IJ SOLN: 12.5000 mg | Freq: Four times a day (QID) | INTRAMUSCULAR | Status: DC | PRN

## 2015-04-29 MED ORDER — KETOROLAC TROMETHAMINE 30 MG/ML IJ SOLN
30.0000 mg | Freq: Four times a day (QID) | INTRAMUSCULAR | Status: DC | PRN
  Administered 2015-04-29 – 2015-04-30 (×3): 30 mg via INTRAVENOUS
  Filled 2015-04-29 (×2): qty 1

## 2015-04-29 MED ORDER — CITRIC ACID-SODIUM CITRATE 334-500 MG/5ML PO SOLN
ORAL | Status: AC
  Administered 2015-04-29: 30 mL via ORAL
  Filled 2015-04-29: qty 15

## 2015-04-29 MED ORDER — CLINDAMYCIN PHOSPHATE 900 MG/50ML IV SOLN
900.0000 mg | INTRAVENOUS | Status: AC
  Administered 2015-04-29: 900 mg via INTRAVENOUS
  Filled 2015-04-29: qty 50

## 2015-04-29 MED ORDER — FENTANYL CITRATE (PF) 100 MCG/2ML IJ SOLN
25.0000 ug | INTRAMUSCULAR | Status: AC | PRN
  Administered 2015-04-29 (×4): 25 ug via INTRAVENOUS

## 2015-04-29 MED ORDER — LACTATED RINGERS IV SOLN
INTRAVENOUS | Status: DC | PRN
  Administered 2015-04-29: 08:00:00 via INTRAVENOUS

## 2015-04-29 MED ORDER — NEOSTIGMINE METHYLSULFATE 10 MG/10ML IV SOLN
INTRAVENOUS | Status: DC | PRN
  Administered 2015-04-29: 3 mg via INTRAVENOUS

## 2015-04-29 MED ORDER — LANOLIN HYDROUS EX OINT: 1.0000 "application " | TOPICAL_OINTMENT | CUTANEOUS | Status: DC | PRN

## 2015-04-29 MED ORDER — SIMETHICONE 80 MG PO CHEW
80.0000 mg | CHEWABLE_TABLET | Freq: Three times a day (TID) | ORAL | Status: DC
  Administered 2015-04-30 – 2015-05-02 (×7): 80 mg via ORAL
  Filled 2015-04-29 (×7): qty 1

## 2015-04-29 MED ORDER — MENTHOL 3 MG MT LOZG
1.0000 | LOZENGE | OROMUCOSAL | Status: DC | PRN
  Filled 2015-04-29: qty 9

## 2015-04-29 MED ORDER — ACETAMINOPHEN 325 MG PO TABS: 650.0000 mg | ORAL_TABLET | ORAL | Status: DC | PRN

## 2015-04-29 MED ORDER — KETOROLAC TROMETHAMINE 30 MG/ML IJ SOLN
INTRAMUSCULAR | Status: AC
  Administered 2015-04-29: 30 mg via INTRAVENOUS
  Filled 2015-04-29: qty 1

## 2015-04-29 MED ORDER — GENTAMICIN SULFATE 40 MG/ML IJ SOLN
1.5000 mg/kg | INTRAVENOUS | Status: AC
  Administered 2015-04-29: 100 mg via INTRAVENOUS
  Filled 2015-04-29: qty 2.5

## 2015-04-29 MED ORDER — GLYCOPYRROLATE 0.2 MG/ML IJ SOLN
INTRAMUSCULAR | Status: DC | PRN
  Administered 2015-04-29: 0.6 mg via INTRAVENOUS

## 2015-04-29 MED ORDER — BUPIVACAINE 0.25 % ON-Q PUMP DUAL CATH 400 ML: 400.0000 mL | INJECTION | Status: DC

## 2015-04-29 SURGICAL SUPPLY — 29 items
CANISTER SUCT 3000ML (MISCELLANEOUS) ×3 IMPLANT
CATH KIT ON-Q SILVERSOAK 5IN (CATHETERS) ×6 IMPLANT
CLOSURE WOUND 1/2 X4 (GAUZE/BANDAGES/DRESSINGS) ×1
DRSG TELFA 3X8 NADH (GAUZE/BANDAGES/DRESSINGS) ×3 IMPLANT
ELECT CAUTERY BLADE 6.4 (BLADE) ×3 IMPLANT
GAUZE SPONGE 4X4 12PLY STRL (GAUZE/BANDAGES/DRESSINGS) ×3 IMPLANT
GLOVE BIO SURGEON STRL SZ7 (GLOVE) ×12 IMPLANT
GLOVE INDICATOR 7.5 STRL GRN (GLOVE) ×3 IMPLANT
GOWN STRL REUS W/ TWL LRG LVL3 (GOWN DISPOSABLE) ×3 IMPLANT
GOWN STRL REUS W/TWL LRG LVL3 (GOWN DISPOSABLE) ×6
HEMOSTAT ARISTA ABSORB 3G PWDR (MISCELLANEOUS) ×3 IMPLANT
LIQUID BAND (GAUZE/BANDAGES/DRESSINGS) ×6 IMPLANT
NS IRRIG 1000ML POUR BTL (IV SOLUTION) ×3 IMPLANT
PACK C SECTION AR (MISCELLANEOUS) ×3 IMPLANT
PAD GROUND ADULT SPLIT (MISCELLANEOUS) ×3 IMPLANT
PAD OB MATERNITY 4.3X12.25 (PERSONAL CARE ITEMS) ×6 IMPLANT
PAD PREP 24X41 OB/GYN DISP (PERSONAL CARE ITEMS) ×3 IMPLANT
SPONGE LAP 18X18 5 PK (GAUZE/BANDAGES/DRESSINGS) ×9 IMPLANT
STRIP CLOSURE SKIN 1/2X4 (GAUZE/BANDAGES/DRESSINGS) ×2 IMPLANT
SUT CHROMIC GUT BROWN 0 54 (SUTURE) ×1 IMPLANT
SUT CHROMIC GUT BROWN 0 54IN (SUTURE) ×3
SUT MNCRL 4-0 (SUTURE)
SUT MNCRL 4-0 27XMFL (SUTURE)
SUT MNCRL AB 4-0 PS2 18 (SUTURE) ×3 IMPLANT
SUT PDS AB 1 TP1 96 (SUTURE) ×3 IMPLANT
SUT PLAIN 2 0 XLH (SUTURE) IMPLANT
SUT VIC AB 0 CT1 36 (SUTURE) ×9 IMPLANT
SUTURE MNCRL 4-0 27XMF (SUTURE) IMPLANT
SWABSTK COMLB BENZOIN TINCTURE (MISCELLANEOUS) ×3 IMPLANT

## 2015-04-29 NOTE — Brief Op Note (Signed)
04/28/2015 - 04/29/2015  9:26 AM  PATIENT:  Cathy Owens  39 y.o. female  PRE-OPERATIVE DIAGNOSIS:  Ceasearn section  POST-OPERATIVE DIAGNOSIS:  Same  PROCEDURE:  Procedure(s): CESAREAN SECTION (N/A)  SURGEON:  Surgeon(s) and Role:    *  Vena AustriaAndreas Diona Peregoy, MD - Primary    * Conard NovakStephen D Jackson MD, Assisting  PHYSICIAN ASSISTANT:   ASSISTANTS: Thomasene MohairJackson, Stephen MD   ANESTHESIA:   general  EBL:  Total I/O In: -  Out: 800 [Blood:800]  BLOOD ADMINISTERED:none  DRAINS: On-Q and foley catheter    LOCAL MEDICATIONS USED:  BUPIVICAINE   SPECIMEN:  Source of Specimen:  placenta  DISPOSITION OF SPECIMEN:  PATHOLOGY  COUNTS:  YES  TOURNIQUET:  * No tourniquets in log *  DICTATION: .Note written in EPIC  PLAN OF CARE: Admit to inpatient   PATIENT DISPOSITION:  PACU - hemodynamically stable.   Delay start of Pharmacological VTE agent (>24hrs) due to surgical blood loss or risk of bleeding: no

## 2015-04-29 NOTE — Op Note (Signed)
Preoperative Diagnosis: 1) 39 y.o. [id number] at 9366w1d 2)Low lying placenta/marginal previa 3) Second episode of vaginal  hemorrhage  Postoperative Diagnosis: 1) 39 y.o. [id number] at 3966w1d 2)Low lying placenta/marginal previa 3) Second episode of vaginal hemorrhage  Operation Performed: Primary low transverse C-section via pfannenstiel skin incision  Indication: Marginal placenta previa second hemorrhage at >[redacted] weeks gestation  Anesthesia: General  Primary Surgeon: Vena AustriaAndreas Laurenashley Viar, MD  Assistant: Thomasene MohairStephen Jackson, MD  Preoperative Antibiotics: Gentamycin & clindamycin  Estimated Blood Loss: 800mL  IV Fluids: 1500mL   Urine Output:: ~2350mL  Drains or Tubes: Foley to gravity drainage, ON-Q catheter system  Implants: none  Specimens Removed: none  Complications: none  Intraoperative Findings:  Normal tubes ovaries and uterus.  Delivery resulted in the birth of a liveborn female , APGAR (1 MIN): 8   APGAR (5 MINS): 8   APGAR (10 MINS):  , weight  pending  Patient Condition: stable  Procedure in Detail:  Patient was taken to the operating room were she was positioned in the supine position, prepped and draped in the usual sterile fashion.  Prior to proceeding with the case a time out was performed and general anesthesia was induced.  Utilizing the scalpel a pfannenstiel skin incision was made 2cm above the pubic symphysis and carried down sharply to the the level of the rectus fascia.  The fascia was incised in the midline using the scalpel and then extended using blunt dissection via Claretha CooperJoel Cohen technique.    The midline was identified, the peritoneum was entered bluntly and expanded using manual tractions.  The uterus was noted to be in a none rotated position.  Next the bladder blade was placed retracting the bladder caudally. The uterus was noted to be in a non-rotated position.  A bladder flap was created. The bladder reflection was grasped with a pickup, and Metzenbaum scissors  were then used the undermine the bladder reflection.  The bladder flap was developed using digital dissection.  The bladder blade was replaced retracting the bladder caudally out of the operative field.  A low transverse incision was scored on the lower uterine segment.  The hysterotomy was entered bluntly using the operators finger.  The hysterotomy incision was extended using manual traction.  The lower lobe of the placenta was at the hysterotomy incision but was able to be elevated out of the way easily.  Amniotomy was performed noting clear fluid.The operators hand was placed within the hysterotomy position noting the fetus to be within the OA position.  The vertex was grasped, flexed, brought to the incision, and delivered a traumatically using fundal pressure.  The remainder of the body delivered with ease.  The infant was suctioned, cord was clamped and cut before handing off to the awaiting neonatologist.  The placenta was delivered using manual extraction with a smooth utero placental interface noted and no difficulty in detaching the placenta.  The uterus was exteriorized, wiped clean of clots and debris using two moist laps.  The right aspect of the uterus had prominent arterial vasculature which was intact but tied off with two O'Leary stitches of 0 Vicryl.  The hysterotomy was closed using a two layer closure of 0 Vicryl, with the first being a running locked, the second a vertical imbricating.   There was a small hematoma on the right lateral  superior aspect of the incision.  A large CTX needle 0 Vicryl was used to place a third O'Leary stitch superior the hematoma. The uterus was returned to the  abdomen.  The peritoneal gutters were wiped clean of clots and debris using two moist laps.  The hysterotomy incision was re-inspected noted to be hemostatic.  The rectus muscles were re-approximated in the midline using a single 2-0 Vicryl mattress stitch.  The rectus muscles were inspected noted to be  hemostatic.  The superior border of the rectus fascia was grasped with a Kocher clamp.  The ON-Q trocars were then placed 4cm above the superior border of the incision and tunneled subfascially.  The introducers were removed and the catheters were threaded through the sleeves after which the sleeves were removed.  The fascia was closed using a looped #1 PDS in a running fashion taking 1cm by 1cm bites.  The subcutaneous tissue was irrigated using warm saline, hemostasis achieved using the bovis.  The subcutaneous dead space was less 3cm and was not closed.  The skin was closed using 4-0 Monocryl in a subcuticular fashion.  Sponge needle and instrument counts were corrects times two.  The patient tolerated the procedure well and was taken to the recovery room in stable condition.

## 2015-04-29 NOTE — Anesthesia Procedure Notes (Signed)
Procedure Name: Intubation Performed by: Zaynah Chawla Pre-anesthesia Checklist: Patient identified, Emergency Drugs available, Suction available, Patient being monitored and Timeout performed Patient Re-evaluated:Patient Re-evaluated prior to inductionOxygen Delivery Method: Circle system utilized Preoxygenation: Pre-oxygenation with 100% oxygen Intubation Type: Cricoid Pressure applied, Rapid sequence and IV induction Laryngoscope Size: Mac and 4 Grade View: Grade I Tube type: Oral Tube size: 7.0 mm Number of attempts: 1 Airway Equipment and Method: Stylet Placement Confirmation: ETT inserted through vocal cords under direct vision,  positive ETCO2,  CO2 detector and breath sounds checked- equal and bilateral Secured at: 21 cm Tube secured with: Tape Comments: Pt had chipped front tooth prior to intubation

## 2015-04-29 NOTE — Anesthesia Postprocedure Evaluation (Signed)
  Anesthesia Post-op Note  Patient: Cathy Owens  Procedure(s) Performed: Procedure(s): CESAREAN SECTION (N/A)  Anesthesia type:General  Patient location: PACU  Post pain: Pain level controlled  Post assessment: Post-op Vital signs reviewed, Patient's Cardiovascular Status Stable, Respiratory Function Stable, Patent Airway and No signs of Nausea or vomiting  Post vital signs: Reviewed and stable  Last Vitals:  Filed Vitals:   04/29/15 0937  BP: 153/90  Pulse: 89  Temp: 37.4 C  Resp:     Level of consciousness: awake, alert  and patient cooperative  Complications: No apparent anesthesia complications

## 2015-04-29 NOTE — Lactation Note (Addendum)
This note was copied from the chart of Boy Cathy Owens. Assisted mom with positioning with pillows on both breasts in biological modified cross cradle hold.  Clicking sound noted towards end of breast feed.  Short, tight frenulum noted with heart-shaped tongue which could be causing clicking sound.  Mom denies nipple pain.

## 2015-04-29 NOTE — Anesthesia Postprocedure Evaluation (Signed)
  Anesthesia Post-op Note  Patient: Cathy Owens  Procedure(s) Performed: Procedure(s): CESAREAN SECTION (N/A)  Anesthesia type:General  Patient location: PACU  Post pain: Pain level controlled  Post assessment: Post-op Vital signs reviewed, Patient's Cardiovascular Status Stable, Respiratory Function Stable, Patent Airway and No signs of Nausea or vomiting  Post vital signs: Reviewed and stable  Last Vitals:  Filed Vitals:   04/29/15 0937  BP: 153/90  Pulse: 89  Temp: 37.4 C  Resp:     Level of consciousness: awake, alert  and patient cooperative  Complications: No apparent anesthesia complications  

## 2015-04-29 NOTE — Anesthesia Preprocedure Evaluation (Signed)
Anesthesia Evaluation  Patient identified by MRN, date of birth, ID band Patient awake    History of Anesthesia Complications (+) PONV  Airway Mallampati: I       Dental   Pulmonary neg pulmonary ROS,    Pulmonary exam normal       Cardiovascular negative cardio ROS Normal cardiovascular exam    Neuro/Psych Anxiety negative neurological ROS     GI/Hepatic negative GI ROS, Neg liver ROS,   Endo/Other  negative endocrine ROS  Renal/GU Renal disease  negative genitourinary   Musculoskeletal negative musculoskeletal ROS (+)   Abdominal Normal abdominal exam  (+)   Peds negative pediatric ROS (+)  Hematology negative hematology ROS (+)   Anesthesia Other Findings   Reproductive/Obstetrics negative OB ROS                             Anesthesia Physical Anesthesia Plan  ASA: II  Anesthesia Plan: General   Post-op Pain Management:    Induction: Intravenous  Airway Management Planned: Oral ETT  Additional Equipment:   Intra-op Plan:   Post-operative Plan: Extubation in OR  Informed Consent: I have reviewed the patients History and Physical, chart, labs and discussed the procedure including the risks, benefits and alternatives for the proposed anesthesia with the patient or authorized representative who has indicated his/her understanding and acceptance.     Plan Discussed with: CRNA and Surgeon  Anesthesia Plan Comments:         Anesthesia Quick Evaluation

## 2015-04-29 NOTE — Anesthesia Postprocedure Evaluation (Signed)
  Anesthesia Post-op Note  Patient: Cathy Owens  Procedure(s) Performed: Procedure(s): CESAREAN SECTION (N/A)  Anesthesia type:General  Patient location: PACU  Post pain: Pain level controlled  Post assessment: Post-op Vital signs reviewed, Patient's Cardiovascular Status Stable, Respiratory Function Stable, Patent Airway and No signs of Nausea or vomiting  Post vital signs: Reviewed and stable  Last Vitals:  Filed Vitals:   04/29/15 0535  BP:   Pulse:   Temp: 36.6 C  Resp: 18    Level of consciousness: awake, alert  and patient cooperative  Complications: No apparent anesthesia complications

## 2015-04-29 NOTE — Transfer of Care (Signed)
Immediate Anesthesia Transfer of Care Note  Patient: Cathy CrockMegan Stiefel  Procedure(s) Performed: Procedure(s): CESAREAN SECTION (N/A)  Patient Location: PACU  Anesthesia Type:General  Level of Consciousness: awake, alert  and oriented  Airway & Oxygen Therapy: Patient Spontanous Breathing  Post-op Assessment: Report given to RN  Post vital signs: stable  Last Vitals:  Filed Vitals:   04/29/15 0937  BP: 153/90  Pulse: 89  Temp: 37.4 C  Resp:     Complications: No apparent anesthesia complications

## 2015-04-30 ENCOUNTER — Encounter: Payer: Self-pay | Admitting: Obstetrics and Gynecology

## 2015-04-30 LAB — CBC
HCT: 26 % — ABNORMAL LOW (ref 35.0–47.0)
Hemoglobin: 9.2 g/dL — ABNORMAL LOW (ref 12.0–16.0)
MCH: 33.4 pg (ref 26.0–34.0)
MCHC: 35.5 g/dL (ref 32.0–36.0)
MCV: 94 fL (ref 80.0–100.0)
PLATELETS: 207 10*3/uL (ref 150–440)
RBC: 2.76 MIL/uL — ABNORMAL LOW (ref 3.80–5.20)
RDW: 13.1 % (ref 11.5–14.5)
WBC: 19.8 10*3/uL — ABNORMAL HIGH (ref 3.6–11.0)

## 2015-04-30 MED ORDER — IBUPROFEN 600 MG PO TABS
600.0000 mg | ORAL_TABLET | Freq: Four times a day (QID) | ORAL | Status: DC | PRN
  Administered 2015-04-30 – 2015-05-02 (×9): 600 mg via ORAL
  Filled 2015-04-30 (×10): qty 1

## 2015-04-30 MED ORDER — OXYCODONE-ACETAMINOPHEN 5-325 MG PO TABS
1.0000 | ORAL_TABLET | ORAL | Status: DC | PRN
  Administered 2015-04-30 – 2015-05-02 (×13): 1 via ORAL
  Filled 2015-04-30: qty 1
  Filled 2015-04-30: qty 2
  Filled 2015-04-30 (×11): qty 1

## 2015-04-30 NOTE — Progress Notes (Signed)
PCA reading 17 mg infused; RR 28; EtCo2 28

## 2015-04-30 NOTE — Progress Notes (Signed)
PCA total infused = 17; Etco2 = 26; RR = 22

## 2015-04-30 NOTE — Progress Notes (Signed)
Subjective:  Doing well, pain well controlled on morphine PCA.  No nausea or emesis.  Lochia moderate   Objective:  Blood pressure 99/63, pulse 73, temperature 97.5 F (36.4 C), temperature source Oral, resp. rate 18, height 5\' 8"  (1.727 m), weight 72.576 kg (160 lb), last menstrual period 07/08/2014, SpO2 96 %, unknown if currently breastfeeding.  Intake/Output Summary (Last 24 hours) at 04/30/15 0959 Last data filed at 04/30/15 0700  Gross per 24 hour  Intake 3801.98 ml  Output   1550 ml  Net 2251.98 ml    General: NAD Pulmonary: no increased work of breathing Abdomen: non-distended, non-tender, fundus firm at level of umbilicus Incision: dressing D/C/I Extremities: no edema, no erythema, no tenderness  Results for orders placed or performed during the hospital encounter of 04/28/15 (from the past 72 hour(s))  RPR     Status: None   Collection Time: 04/28/15  1:29 PM  Result Value Ref Range   RPR Ser Ql Non Reactive Non Reactive    Comment: (NOTE) Performed At: [facility] [address] [place], [place] [id number] Mila HomerHancock William F MD JY:[id number]:[phone]   CBC     Status: Abnormal   Collection Time: 04/28/15  1:29 PM  Result Value Ref Range   WBC 17.1 (H) 3.6 - 11.0 K/uL   RBC 3.71 (L) 3.80 - 5.20 MIL/uL   Hemoglobin 12.2 12.0 - 16.0 g/dL   HCT 86.535.0 78.435.0 - 69.647.0 %   MCV 94.3 80.0 - 100.0 fL   MCH 32.8 26.0 - 34.0 pg   MCHC 34.7 32.0 - 36.0 g/dL   RDW 29.513.1 28.411.5 - 13.214.5 %   Platelets 280 150 - 440 K/uL  Differential     Status: Abnormal   Collection Time: 04/28/15  1:29 PM  Result Value Ref Range   Neutrophils Relative % 79 %   Neutro Abs 13.5 (H) 1.4 - 6.5 K/uL   Lymphocytes Relative 14 %   Lymphs Abs 2.4 1.0 - 3.6 K/uL   Monocytes Relative 5 %   Monocytes Absolute 0.9 0.2 - 0.9 K/uL   Eosinophils Relative 1 %   Eosinophils Absolute 0.2 0 - 0.7 K/uL   Basophils Relative 1 %   Basophils Absolute 0.1 0 - 0.1 K/uL  Kleihauer-Betke stain     Status: None   Collection Time: 04/28/15  1:29 PM  Result Value Ref Range   Fetal Cells % 0.0 %   Quantitation Fetal Hemoglobin 0.0 mL   # Vials RhIg 1   Type and screen     Status: None (Preliminary result)   Collection Time: 04/28/15  1:41 PM  Result Value Ref Range   ABO/RH(D) A NEG    Antibody Screen POS    Sample Expiration 05/01/2015    Antibody Identification PASSIVELY ACQUIRED ANTI-D    Unit Number [id number]    Blood Component Type RBC, LR IRR    Unit division 00    Status of Unit ALLOCATED    Transfusion Status OK TO TRANSFUSE    Crossmatch Result COMPATIBLE    Unit Number [id number]    Blood Component Type RBC, LR IRR    Unit division 00    Status of Unit ALLOCATED    Transfusion Status OK TO TRANSFUSE    Crossmatch Result COMPATIBLE    Unit Number [id number]    Blood Component Type RBC LR PHER1    Unit division 00    Status of Unit ALLOCATED    Transfusion Status OK TO TRANSFUSE  Crossmatch Result COMPATIBLE    Unit Number [id number]    Blood Component [id number] RED CELLS,LR    Unit division 00    Status of Unit ALLOCATED    Transfusion Status OK TO TRANSFUSE    Crossmatch Result COMPATIBLE   Prepare RBC     Status: None   Collection Time: 04/28/15  1:41 PM  Result Value Ref Range   Order Confirmation ORDER PROCESSED BY BLOOD BANK   ABO/Rh     Status: None   Collection Time: 04/28/15  1:42 PM  Result Value Ref Range   ABO/RH(D) A NEG   CBC     Status: Abnormal   Collection Time: 04/30/15  5:21 AM  Result Value Ref Range   WBC 19.8 (H) 3.6 - 11.0 K/uL   RBC 2.76 (L) 3.80 - 5.20 MIL/uL   Hemoglobin 9.2 (L) 12.0 - 16.0 g/dL   HCT 78.426.0 (L) 69.635.0 - 29.547.0 %   MCV 94.0 80.0 - 100.0 fL   MCH 33.4 26.0 - 34.0 pg   MCHC 35.5 32.0 - 36.0 g/dL   RDW 28.413.1 13.211.5 - 44.014.5 %   Platelets 207 150 - 440 K/uL     Assessment:   39 y.o. [id number] postoperativeday # 1 primary LTCS and    Plan:  1) Acute blood loss anemia - hemodynamically stable and asymptomatic - po ferrous  sulfate  2) --/--/A NEG (05/13 1342) / Immune (12/11 0000) / Varicella Immune - fetus rh negative rhogam not indicated  3) TDAP status  Up to date  4) Breast/Contraception undecided  5) Disposition anticipated discharge POD3-4

## 2015-04-30 NOTE — Progress Notes (Signed)
PCA total infused = 22; Etco2 = 39; RR = 20; shift change verified with susan hedrick rn

## 2015-05-01 NOTE — Progress Notes (Signed)
Subjective: Postpartum Day 2: Cesarean Delivery for low lying placenta with bleeding  Patient reports no problems voiding.  Pain and bleeding currently controlled. Reports a sore throat.   Objective: Vital signs in last 24 hours: Temp:  [97.9 F (36.6 C)-98.4 F (36.9 C)] 97.9 F (36.6 C) (05/16 0736) Pulse Rate:  [74-94] 74 (05/16 0736) Resp:  [18-20] 18 (05/16 0736) BP: (106-115)/(61-72) 115/71 mmHg (05/16 0736) SpO2:  [98 %-99 %] 99 % (05/16 0736)  Physical Exam:  General: alert, cooperative and no distress Lochia: appropriate Uterine Fundus: firm Incision: healing well, C/D/I DVT Evaluation: No evidence of DVT seen on physical exam.   Recent Labs  04/28/15 1329 04/30/15 0521  HGB 12.2 9.2*  HCT 35.0 26.0*    Assessment/Plan: Status post Cesarean section. Doing well postoperatively.  Continue current care. Continue breastfeeding. Anticipate discharge on POD 3-4   Sanela Evola,  Toni AmendCourtney 05/01/2015, 10:54 AM

## 2015-05-02 LAB — TYPE AND SCREEN
ABO/RH(D): A NEG
Antibody Screen: POSITIVE
UNIT DIVISION: 0
UNIT DIVISION: 0
Unit division: 0
Unit division: 0

## 2015-05-02 LAB — SURGICAL PATHOLOGY

## 2015-05-02 MED ORDER — DOCUSATE SODIUM 100 MG PO CAPS: 100.0000 mg | ORAL_CAPSULE | Freq: Two times a day (BID) | ORAL | Status: DC

## 2015-05-02 MED ORDER — IBUPROFEN 800 MG PO TABS
800.0000 mg | ORAL_TABLET | Freq: Four times a day (QID) | ORAL | Status: DC | PRN
Start: 2015-05-02 — End: 2017-07-24

## 2015-05-02 MED ORDER — OXYCODONE-ACETAMINOPHEN 5-325 MG PO TABS: 1.0000 | ORAL_TABLET | ORAL | Status: DC | PRN

## 2015-05-02 MED ORDER — FERROUS SULFATE 325 (65 FE) MG PO TABS: 325.0000 mg | ORAL_TABLET | Freq: Two times a day (BID) | ORAL | Status: DC

## 2015-05-02 MED ORDER — WHITE PETROLATUM GEL
Status: AC
  Filled 2015-05-02: qty 10

## 2015-05-02 NOTE — Discharge Summary (Signed)
Obstetric Discharge Summary Reason for Admission: cesarean section for low lying placenta with bleeding  Prenatal Procedures: NST and ultrasound Intrapartum Procedures: cesarean: low cervical, transverse Postpartum Procedures: none Complications-Operative and Postpartum: none HEMOGLOBIN  Date Value Ref Range Status  04/30/2015 9.2* 12.0 - 16.0 g/dL Final  45/40/981112/10/2014 91.413.9 g/dL Final   HGB  Date Value Ref Range Status  04/15/2015 11.8* 12.0-16.0 g/dL Final   HCT  Date Value Ref Range Status  04/30/2015 26.0* 35.0 - 47.0 % Final  04/15/2015 32.9* 35.0-47.0 % Final  11/25/2014 40 % Final    Physical Exam:  General: alert, cooperative, appears stated age and no distress Lochia: appropriate Uterine Fundus: firm Incision: healing well, c/d/i OTA DVT Evaluation: No evidence of DVT seen on physical exam.  Discharge Diagnoses: Term Pregnancy-delivered and low lying placenta with vaginal bleeding   Discharge Information: Date: 05/02/2015 Activity: unrestricted Diet: routine Medications: PNV, Ibuprofen, Colace, Iron and Percocet Condition: stable Instructions: refer to practice specific booklet Discharge to: home Follow-up Information    [facility]. Schedule an appointment as soon as possible for a visit in 3 days.   Why:  for incision check    Contact information:   [phone]      Newborn Data: Live born female  Birth Weight: 7 lb 4.6 oz (3306 g) APGAR: 8, 8  Home with mother.  Jannet MantisSubudhi,  Tanyon Alipio 05/02/2015, 11:07 AM

## 2015-05-02 NOTE — Discharge Instructions (Signed)
Call your doctor for incision concerns including redness, swelling, bleeding or drainage, or if begins to come apart.  Keep incision clean and dry.  Call your doctor for increased pain or vaginal bleeding, temperature above 100.4, depression, or concerns.  Increase calories and fluids while breastfeeding.  Continue prenatal vitamin and iron.  No strenuous activity or heavy lifting for 6 weeks.  No intercourse, tampons, douching, or enemas for 6 weeks.  No tub baths - showers only.  No driving for 2 weeks or while taking Percocet.

## 2015-05-02 NOTE — Progress Notes (Signed)
Discharge instructions provided.  Pt and sig other verbalize understanding of all instructions and follow-up care.  Prescriptions given.  Incision hygiene kit given.  Pt discharged to home with infant at 1749 on 05/02/15 via wheelchair by RN. Reed Breech, RN 05/02/2015 6:26 PM

## 2015-05-02 NOTE — Progress Notes (Addendum)
Prenatal records indicate that pt received TDaP vaccine on 03/30/2015.Marland Kitchen. Reynold BowenSusan Paisley Lula Kolton, RN 05/02/2015 2:16 PM

## 2015-05-03 NOTE — Anesthesia Postprocedure Evaluation (Signed)
  Anesthesia Post-op Note  Patient: Cathy Owens  Procedure(s) Performed: Procedure(s): CESAREAN SECTION (N/A)  Anesthesia type:General  Patient location: PACU  Post pain: Pain level controlled  Post assessment: Post-op Vital signs reviewed, Patient's Cardiovascular Status Stable, Respiratory Function Stable, Patent Airway and No signs of Nausea or vomiting  Post vital signs: Reviewed and stable  Last Vitals:  Filed Vitals:   05/02/15 1637  BP: 131/75  Pulse: 64  Temp: 36.8 C  Resp: 18    Level of consciousness: awake, alert  and patient cooperative  Complications: No apparent anesthesia complications

## 2015-05-04 ENCOUNTER — Ambulatory Visit: Payer: BC Managed Care – PPO

## 2015-05-10 NOTE — Addendum Note (Signed)
Addendum  created 05/10/15 1240 by Lezlie OctaveGijsbertus F Van Staveren, MD   Modules edited: Anesthesia Responsible Staff

## 2015-06-07 LAB — HM PAP SMEAR: HM PAP: NORMAL

## 2015-07-05 ENCOUNTER — Other Ambulatory Visit: Payer: Self-pay | Admitting: Student

## 2015-07-05 DIAGNOSIS — M25511 Pain in right shoulder: Principal | ICD-10-CM

## 2015-07-05 DIAGNOSIS — G8929 Other chronic pain: Secondary | ICD-10-CM

## 2015-07-11 ENCOUNTER — Ambulatory Visit
Admission: RE | Admit: 2015-07-11 | Discharge: 2015-07-11 | Disposition: A | Discharge status: Home / Self Care | Payer: BC Managed Care – PPO | Source: Ambulatory Visit | Attending: Student | Admitting: Student

## 2015-07-11 DIAGNOSIS — M25511 Pain in right shoulder: Secondary | ICD-10-CM

## 2015-07-11 DIAGNOSIS — G8929 Other chronic pain: Secondary | ICD-10-CM

## 2015-07-11 DIAGNOSIS — M7551 Bursitis of right shoulder: Secondary | ICD-10-CM | POA: Insufficient documentation

## 2015-07-12 ENCOUNTER — Ambulatory Visit: Payer: BC Managed Care – PPO

## 2015-10-24 ENCOUNTER — Ambulatory Visit: Payer: BC Managed Care – PPO | Attending: Student

## 2015-10-24 DIAGNOSIS — R29898 Other symptoms and signs involving the musculoskeletal system: Secondary | ICD-10-CM

## 2015-10-24 DIAGNOSIS — M25611 Stiffness of right shoulder, not elsewhere classified: Secondary | ICD-10-CM

## 2015-10-24 DIAGNOSIS — M25511 Pain in right shoulder: Secondary | ICD-10-CM | POA: Insufficient documentation
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
## 2015-10-26 ENCOUNTER — Ambulatory Visit: Payer: BC Managed Care – PPO

## 2015-10-26 DIAGNOSIS — M25611 Stiffness of right shoulder, not elsewhere classified: Secondary | ICD-10-CM

## 2015-10-26 DIAGNOSIS — R29898 Other symptoms and signs involving the musculoskeletal system: Secondary | ICD-10-CM

## 2015-10-26 DIAGNOSIS — M25511 Pain in right shoulder: Secondary | ICD-10-CM | POA: Diagnosis not present

## 2015-10-26 NOTE — Therapy (Signed)
[place] [facility] [address] [place], [place], [address] Phone: [phone]   Fax:  [phone]  Physical Therapy Treatment  Patient Details  Name: Cathy CrockMegan Janco MRN: [id number] Date of Birth: 02/19/1976 No Data Recorded  Encounter Date: 10/26/2015      PT End of Session - 10/26/15 2031    Visit Number 2   Number of Visits 9   Date for PT Re-Evaluation 11/22/15   PT Start Time 1740   PT Stop Time 1825   PT Time Calculation (min) 45 min   Activity Tolerance Patient tolerated treatment well   Behavior During Therapy [facility] for tasks assessed/performed      Past Medical History  Diagnosis Date  . Kidney stones     Past Surgical History  Procedure Laterality Date  . Cholecystectomy  2009  . Index finger surgery      as a child  . Wisdom tooth extraction    . Lithotripsy  2013  . Gallbladder surgery    . Cesarean section N/A 04/29/2015    Procedure: CESAREAN SECTION;  Surgeon: Conard NovakStephen D Jackson, MD;  Location: [facility] ORS;  Service: Obstetrics;  Laterality: N/A;    There were no vitals filed for this visit.  Visit Diagnosis:  Decreased ROM of right shoulder  Weakness of shoulder      Subjective Assessment - 10/26/15 2028    Subjective pt relates she feels better after doing the self-ice masssage.  pt currently has 4/10 pain in her shoulder and upper back.  pt notes decreased tension after perfomring upper trap stretch    Patient Stated Goals improve function and decrease pain    Currently in Pain? Yes   Pain Score 4    Pain Location Shoulder   Pain Orientation Right            [facility] Assessment - 10/25/15 1853    Assessment   Medical Diagnosis R RTC tendonitis    Onset Date/Surgical Date 11/16/14   Hand Dominance Right   Prior Therapy none   Precautions   Precautions None   Restrictions   Weight Bearing Restrictions No   Balance Screen   Has the patient fallen in the past 6 months No   Has the  patient had a decrease in activity level because of a fear of falling?  Yes   Is the patient reluctant to leave their home because of a fear of falling?  No   Home Nurse, mental healthnvironment   Living Environment Private residence   Living Arrangements Spouse/significant other;Children   Available Help at Discharge Family   Type of Home House   Home Access Stairs to enter   Entrance Stairs-Number of Steps 3   Entrance Stairs-Rails Left   Home Layout Two level   Alternate Level Stairs-Number of Steps 12   Alternate Level Stairs-Rails Right   Home Equipment None   Prior Function   Level of Independence Independent   Vocation Other (comment)  homemaker   Cognition   Overall Cognitive Status Within Functional Limits for tasks assessed   Sensation   Light Touch Appears Intact   Coordination   Gross Motor Movements are Fluid and Coordinated Yes      Manual therapy: suboccipital release  Soft tissue mobilization (effluerage and ischemic compression)  to right upper trap in supine and sitting to decrease myofacial trigger point restrictions  soft tissue mobilization (effluerage and ischemic compression) to right biceps in supine to decrease myofacial trigger point  restrictions  Grade 3 AP and inferior glenohumeral glides x30  Sec R shoulder PROM: flexion, abduction, ER/IR all ~ WNL and with minimal pain Passive right biceps stretch 3x30 sec Scapular assist: (+)  There ex: Scapular retractions x10, pt was unable to retraction her scapulas with cues Shoulder extension with yellow and red band x10, deferred due to increased shrugging and minimal periscapular activation  Isometric shoulder extension into table x10,  Biceps stretch 2x30 sec, pt required cueing for correct technique                       PT Education - 10/26/15 2030    Education provided Yes   Education Details strengthening and training scapular retractors, upward rotators to improve SA space    Person(s) Educated  Patient   Methods Explanation   Comprehension Verbalized understanding             PT Long Term Goals - 10/25/15 1909    PT LONG TERM GOAL #1   Title pt's quick DASH score will improve by at least 8 ponts indicating improved physical function and symptoms of L shoulder    Baseline 66.6% on 11/08   Time 4   Period Weeks   PT LONG TERM GOAL #2   Title pt's left shoulder strength will be at least 4/5 in all planes to improve her ability to participate in pilates   Baseline less than 4/5 in all planes and limited by pain on 11/08   Time 4   Period Weeks   PT LONG TERM GOAL #3   Title pt's fuctional flexion ROM will improve to at least 120 degrees to reach for dishes in top counter with less than 2/10   Baseline 90 degrees of AROM in sitting with 5/10 pain    Time 4   Period Weeks   Status New               Plan - 10/26/15 2032    Clinical Impression Statement pt presents with improved PROM and glenohumeral mobility after performing her HEP.  pt was able to achive improved functional flexion AROM with manual scapular assisst, which is consistent with shoulder impingement. pt was not able to engage her periscapular muscles today and will need further instruction to engage those muscles for improved glenohumeral scapular rhythm   Pt will benefit from skilled therapeutic intervention in order to improve on the following deficits Postural dysfunction;Decreased strength;Hypomobility;Impaired flexibility;Pain;Impaired UE functional use   Rehab Potential Fair   PT Frequency 2x / week   PT Duration 4 weeks   PT Treatment/Interventions ADLs/Self Care Home Management;Aquatic Therapy;Cryotherapy;Electrical Stimulation;Moist Heat;Iontophoresis /ml Dexamethasone;Therapeutic exercise;Therapeutic activities;Neuromuscular re-education;Manual techniques;Dry needling;Passive range of motion   PT Next Visit Plan progress HEP        Problem List Patient Active Problem List    Diagnosis Date Noted  . S/P primary low transverse C-section 04/29/2015  . Vaginal bleeding in pregnancy 04/28/2015  . Marginal placenta 04/28/2015  . Low lying placenta with hemorrhage in third trimester, antepartum 04/15/2015  . Sternal contusion 12/22/2014   Janus Molder, SPT This entire session was performed under direct supervision and direction of a licensed therapist/therapist assistant . I have personally read, edited and approve of the note as written. Carlyon Shadow. Tortorici, PT, DPT [phone]  Tortorici,Ashley 10/26/2015, 10:50 PM  [place] [facility] MAIN [facility] SERVICES [address] [place], [place], [address] Phone: [phone]   Fax:  [phone]  Name:  Osiris Charles MRN: [id number] Date of Birth: [birth year]

## 2015-10-31 ENCOUNTER — Ambulatory Visit: Payer: BC Managed Care – PPO

## 2015-10-31 DIAGNOSIS — M25511 Pain in right shoulder: Secondary | ICD-10-CM | POA: Diagnosis not present

## 2015-10-31 DIAGNOSIS — M25611 Stiffness of right shoulder, not elsewhere classified: Secondary | ICD-10-CM

## 2015-10-31 DIAGNOSIS — R29898 Other symptoms and signs involving the musculoskeletal system: Secondary | ICD-10-CM

## 2015-11-01 NOTE — Therapy (Signed)
[place] [facility] [address] [place], [place], [address] Phone: [phone]   Fax:  [phone]  Physical Therapy Treatment  Patient Details  Name: Cathy Owens MRN: [id number] Date of Birth: 02/27/1976 No Data Recorded  Encounter Date: 10/31/2015      PT End of Session - 11/01/15 1825    Visit Number 3   Number of Visits 9   Date for PT Re-Evaluation 11/22/15   PT Start Time 1745   PT Stop Time 1830   PT Time Calculation (min) 45 min   Activity Tolerance Patient tolerated treatment well   Behavior During Therapy [facility] for tasks assessed/performed      Past Medical History  Diagnosis Date  . Kidney stones     Past Surgical History  Procedure Laterality Date  . Cholecystectomy  2009  . Index finger surgery      as a child  . Wisdom tooth extraction    . Lithotripsy  2013  . Gallbladder surgery    . Cesarean section N/A 04/29/2015    Procedure: CESAREAN SECTION;  Surgeon: Conard NovakStephen D Jackson, MD;  Location: [facility] ORS;  Service: Obstetrics;  Laterality: N/A;    There were no vitals filed for this visit.  Visit Diagnosis:  Decreased ROM of right shoulder  Weakness of shoulder      Subjective Assessment - 11/01/15 1622    Subjective Pt relates she her right shoulder feels sore today and with more pain from helping her son with a project.  She had to perform a lot of repetitive shoulder flexion/extension movements.  pt would like to review the exercises from her last session for correct technique.     Patient Stated Goals improve function and decrease pain    Currently in Pain? Yes   Pain Score 5    Pain Location Shoulder   Pain Orientation Right   Pain Descriptors / Indicators Aching      Manual therapy: Thoracic P/A mobs grade 2-3 3x30 sec T1-T7  Pt's thoracic spine mobility improved with P/A mobs R shoulder AROM in sitting pre manual therapy: Flexion: 110 R shoulder AROM in sitting post manual  therapy Flexion: 116  There ex: Prone horizontal abduction with shoulder externally rotated 2x10, pt required tactile cueing to decrease shrugging Supine pec stretch with shoulders ~30 abducted and externally rotated Scapular retractions x10 Shoulder extension with shoulder externally rotated and with yellow band 2x10, required tactile cues to decrease shoulder shrugging  Educated pt about plan care about addressing her posture and periscapular musculature to decrease stress on her rotator cuff and improve shoulder mobility                             PT Education - 11/01/15 1626    Education provided Yes   Education Details plan of care, prone horizontal abduction with shoulder externally rotated   Person(s) Educated Patient   Methods Explanation   Comprehension Verbalized understanding             PT Long Term Goals - 10/25/15 1909    PT LONG TERM GOAL #1   Title pt's quick DASH score will improve by at least 8 ponts indicating improved physical function and symptoms of L shoulder    Baseline 66.6% on 11/08   Time 4   Period Weeks   PT LONG TERM GOAL #2   Title pt's left shoulder strength will be at least  4/5 in all planes to improve her ability to participate in pilates   Baseline less than 4/5 in all planes and limited by pain on 11/08   Time 4   Period Weeks   PT LONG TERM GOAL #3   Title pt's fuctional flexion ROM will improve to at least 120 degrees to reach for dishes in top counter with less than 2/10   Baseline 90 degrees of AROM in sitting with 5/10 pain    Time 4   Period Weeks   Status New               Plan - 11/01/15 1831    Clinical Impression Statement pt responded well to thoracic mobs with improved shoulder flexion and abduction in sitting .  pt presents with hypomobile thoracic spine, forward posture with rounded shoulders.  Pt was able to achieve full AROM flexion in sitting with postural cues and manual upward  rotation of the scapula.  Pt was able to engage her periscapular muscles with prone horizontal abduction put not with in sitting.  Pt would benefit from continued skilled PT services to address her posture and neuromuscular re-education of her periscapular muscle to improve scapular mechanics.     Pt will benefit from skilled therapeutic intervention in order to improve on the following deficits Postural dysfunction;Decreased strength;Hypomobility;Impaired flexibility;Pain;Impaired UE functional use   Rehab Potential Fair   PT Frequency 2x / week   PT Duration 4 weeks   PT Treatment/Interventions ADLs/Self Care Home Management;Aquatic Therapy;Cryotherapy;Electrical Stimulation;Moist Heat;Iontophoresis /ml Dexamethasone;Therapeutic exercise;Therapeutic activities;Neuromuscular re-education;Manual techniques;Dry needling;Passive range of motion   PT Next Visit Plan postural education         Problem List Patient Active Problem List   Diagnosis Date Noted  . S/P primary low transverse C-section 04/29/2015  . Vaginal bleeding in pregnancy 04/28/2015  . Marginal placenta 04/28/2015  . Low lying placenta with hemorrhage in third trimester, antepartum 04/15/2015  . Sternal contusion 12/22/2014   Janus Molder, SPT This entire session was performed under direct supervision and direction of a licensed therapist/therapist assistant . I have personally read, edited and approve of the note as written. Carlyon Shadow. Tortorici, PT, DPT [phone]  Tortorici,Ashley 11/02/2015, 8:20 AM  [facility] MAIN [facility] SERVICES [address] [place], [place], [address] Phone: [phone]   Fax:  [phone]  Name: Cathy Owens MRN: [id number] Date of Birth: 11/05/1976

## 2015-11-14 ENCOUNTER — Ambulatory Visit: Payer: BC Managed Care – PPO

## 2015-11-14 DIAGNOSIS — M25511 Pain in right shoulder: Secondary | ICD-10-CM | POA: Diagnosis not present

## 2015-11-14 DIAGNOSIS — R29898 Other symptoms and signs involving the musculoskeletal system: Secondary | ICD-10-CM

## 2015-11-14 DIAGNOSIS — M25611 Stiffness of right shoulder, not elsewhere classified: Secondary | ICD-10-CM

## 2015-11-16 ENCOUNTER — Ambulatory Visit: Payer: BC Managed Care – PPO | Attending: Student

## 2015-11-16 DIAGNOSIS — R29898 Other symptoms and signs involving the musculoskeletal system: Secondary | ICD-10-CM

## 2015-11-16 DIAGNOSIS — M25511 Pain in right shoulder: Secondary | ICD-10-CM | POA: Diagnosis present

## 2015-11-16 DIAGNOSIS — M25611 Stiffness of right shoulder, not elsewhere classified: Secondary | ICD-10-CM

## 2015-11-16 NOTE — Patient Instructions (Signed)
[email or web address] Repeated T spine extension 2x10 Low row yellow band 2x10 IR/ER with 1lb weight in supine (R shoulder) 2x10 sidelying ER 0lbs 2x10
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
## 2015-11-16 NOTE — Therapy (Signed)
[facility] [address] [place], [place], [address] Phone: [phone]   Fax:  [phone]  Physical Therapy Treatment  Patient Details  Name: Cathy CrockMegan Owens MRN: [id number] Date of Birth: 09/23/1976 No Data Recorded  Encounter Date: 11/14/2015      PT End of Session - 11/16/15 1835    Visit Number 4   Number of Visits 9   Date for PT Re-Evaluation 11/22/15   PT Start Time 1738   PT Stop Time 1825   PT Time Calculation (min) 47 min   Activity Tolerance Patient tolerated treatment well   Behavior During Therapy [facility] for tasks assessed/performed      Past Medical History  Diagnosis Date  . Kidney stones     Past Surgical History  Procedure Laterality Date  . Cholecystectomy  2009  . Index finger surgery      as a child  . Wisdom tooth extraction    . Lithotripsy  2013  . Gallbladder surgery    . Cesarean section N/A 04/29/2015    Procedure: CESAREAN SECTION;  Surgeon: Conard NovakStephen D Jackson, MD;  Location: [facility] ORS;  Service: Obstetrics;  Laterality: N/A;    There were no vitals filed for this visit.  Visit Diagnosis:  Decreased ROM of right shoulder  Weakness of shoulder      Subjective Assessment - 11/16/15 1832    Subjective pt reports her shoulder feels worse. she reports she has a lot of pain trying to move it especially with motions of ER and feeding her child.    Patient Stated Goals improve function and decrease pain    Currently in Pain? Yes   Pain Score 6    Pain Location --  R anterior shoulder     therex:  Repeated T spine extension 2x10 Low row yellow band 2x10 IR/ER with 1lb weight in supine (R shoulder) 2x10 sidelying ER 0lbs 2x10 Posture ed x 5 min Pt requires min verbal and tactile cues for proper exercise performance  Pt requires mod cues for posture correction  Manual therapy: Soft tissue massage to biceps  Cross friction massage to to biceps tendon Soft tissue massage to R  deltoid Ischemic trigger point release to biceps  Rotational T spine mobs grade 3 T1-12 1 bout of 30s each                           PT Education - 11/16/15 1834    Education provided Yes   Education Details posture ed, resting scapular position, asked pt to call MD regarding ionto/dex use with breast feeding    Person(s) Educated Patient   Methods Explanation   Comprehension Verbalized understanding             PT Long Term Goals - 10/25/15 1909    PT LONG TERM GOAL #1   Title pt's quick DASH score will improve by at least 8 ponts indicating improved physical function and symptoms of L shoulder    Baseline 66.6% on 11/08   Time 4   Period Weeks   PT LONG TERM GOAL #2   Title pt's left shoulder strength will be at least 4/5 in all planes to improve her ability to participate in pilates   Baseline less than 4/5 in all planes and limited by pain on 11/08   Time 4   Period Weeks   PT LONG TERM GOAL #3   Title pt's fuctional  flexion ROM will improve to at least 120 degrees to reach for dishes in top counter with less than 2/10   Baseline 90 degrees of AROM in sitting with 5/10 pain    Time 4   Period Weeks   Status New               Plan - 11/16/15 1835    Clinical Impression Statement pt responded well to thoracic mobiliztions. she reports her bicep is sore, but no worse. progressed HEP for strengthening program of postural and RTC muscles.    Pt will benefit from skilled therapeutic intervention in order to improve on the following deficits Postural dysfunction;Decreased strength;Hypomobility;Impaired flexibility;Pain;Impaired UE functional use   Rehab Potential Fair   PT Frequency 2x / week   PT Duration 4 weeks   PT Treatment/Interventions ADLs/Self Care Home Management;Aquatic Therapy;Cryotherapy;Electrical Stimulation;Moist Heat;Iontophoresis /ml Dexamethasone;Therapeutic exercise;Therapeutic activities;Neuromuscular re-education;Manual  techniques;Dry needling;Passive range of motion   PT Next Visit Plan postural education         Problem List Patient Active Problem List   Diagnosis Date Noted  . S/P primary low transverse C-section 04/29/2015  . Vaginal bleeding in pregnancy 04/28/2015  . Marginal placenta 04/28/2015  . Low lying placenta with hemorrhage in third trimester, antepartum 04/15/2015  . Sternal contusion 12/22/2014   Cathy Owens, PT, DPT [phone]  Cathy Owens 11/16/2015, 6:37 PM  [place] [facility] MAIN [facility] SERVICES [address] [place], [place], [address] Phone: [phone]   Fax:  [phone]  Name: Cathy Owens MRN: [id number] Date of Birth: 1976-01-24

## 2015-11-21 ENCOUNTER — Ambulatory Visit: Payer: BC Managed Care – PPO

## 2015-11-21 DIAGNOSIS — R29898 Other symptoms and signs involving the musculoskeletal system: Secondary | ICD-10-CM

## 2015-11-21 DIAGNOSIS — M25511 Pain in right shoulder: Secondary | ICD-10-CM | POA: Diagnosis not present

## 2015-11-21 DIAGNOSIS — M25611 Stiffness of right shoulder, not elsewhere classified: Secondary | ICD-10-CM

## 2015-11-22 NOTE — Therapy (Signed)
[facility] [address] [place], [place], [address] Phone: [phone]   Fax:  [phone]  Physical Therapy Treatment  Patient Details  Name: Cathy Owens MRN: [id number] Date of Birth: 08/22/1976 No Data Recorded  Encounter Date: 11/21/2015      PT End of Session - 11/22/15 [id number]    Visit Number 5   Number of Visits 9   Date for PT Re-Evaluation 11/22/15   PT Start Time 1732   PT Stop Time 1815   PT Time Calculation (min) 43 min   Activity Tolerance Patient tolerated treatment well   Behavior During Therapy [facility] for tasks assessed/performed      Past Medical History  Diagnosis Date  . Kidney stones     Past Surgical History  Procedure Laterality Date  . Cholecystectomy  2009  . Index finger surgery      as a child  . Wisdom tooth extraction    . Lithotripsy  2013  . Gallbladder surgery    . Cesarean section N/A 04/29/2015    Procedure: CESAREAN SECTION;  Surgeon: Conard NovakStephen D Jackson, MD;  Location: [facility] ORS;  Service: Obstetrics;  Laterality: N/A;    There were no vitals filed for this visit.  Visit Diagnosis:  Decreased ROM of right shoulder  Weakness of shoulder      Subjective Assessment - 11/22/15 0807    Subjective pt reports her shoulder isnt aching constantly anymore and she has less pain with movement. She also states her pediatrician "OKed" her for Ionto using dex.    Patient Stated Goals improve function and decrease pain    Currently in Pain? Yes   Pain Score 3    Pain Location --  posterior and anterior R shoulder       ionto:  Skin prep with ETOH swab. ionto STAT patch applied to      R biceps tendon              With 4mg /ml Dexamethasone. Pt instructed on wear time, potential skin irritations and precautions   Manual therapy: Soft tissue massage to biceps , triceps and deltoid Cross friction massage to to biceps tendon Soft tissue massage to R deltoid Ischemic trigger point  release to biceps and deltoid Rotational T spine mobs grade 3 T1-12 1 bout of 30s each  Therex: Prone shoulder extension and horiz abduction 2x10 each Eccentric biceps curl: yellow band 2x10 Triceps ext yellow band 2x10 Wall slide with lift off x 10 Pt requires min verbal and tactile cues for proper exercise performance                           PT Education - 11/22/15 [id number]    Education provided Yes   Education Details ionto patch wear   Person(s) Educated Patient   Methods Explanation   Comprehension Verbalized understanding             PT Long Term Goals - 10/25/15 1909    PT LONG TERM GOAL #1   Title pt's quick DASH score will improve by at least 8 ponts indicating improved physical function and symptoms of L shoulder    Baseline 66.6% on 11/08   Time 4   Period Weeks   PT LONG TERM GOAL #2   Title pt's left shoulder strength will be at least 4/5 in all planes to improve her ability to participate in pilates   Baseline less than  4/5 in all planes and limited by pain on 11/08   Time 4   Period Weeks   PT LONG TERM GOAL #3   Title pt's fuctional flexion ROM will improve to at least 120 degrees to reach for dishes in top counter with less than 2/10   Baseline 90 degrees of AROM in sitting with 5/10 pain    Time 4   Period Weeks   Status New               Plan - 11/22/15 0813    Clinical Impression Statement pt did well with progression of therex in PT today. pt also has less tightness and trigger points palpated in the biceps and deltoid with associated less tenderness. pt now has full ROM with less pain. pt continues to respond well to T spine mobs and posture correction however still needs cues.    Pt will benefit from skilled therapeutic intervention in order to improve on the following deficits Postural dysfunction;Decreased strength;Hypomobility;Impaired flexibility;Pain;Impaired UE functional use   Rehab Potential Fair   PT  Frequency 2x / week   PT Duration 4 weeks   PT Treatment/Interventions ADLs/Self Care Home Management;Aquatic Therapy;Cryotherapy;Electrical Stimulation;Moist Heat;Iontophoresis /ml Dexamethasone;Therapeutic exercise;Therapeutic activities;Neuromuscular re-education;Manual techniques;Dry needling;Passive range of motion   PT Next Visit Plan postural education         Problem List Patient Active Problem List   Diagnosis Date Noted  . S/P primary low transverse C-section 04/29/2015  . Vaginal bleeding in pregnancy 04/28/2015  . Marginal placenta 04/28/2015  . Low lying placenta with hemorrhage in third trimester, antepartum 04/15/2015  . Sternal contusion 12/22/2014   Cathy Owens, PT, DPT [phone]  Cathy Owens 11/22/2015, 8:14 AM  [place] [facility] MAIN [facility] SERVICES [address] [place], [place], [address] Phone: [phone]   Fax:  [phone]  Name: Cathy Owens MRN: [id number] Date of Birth: Feb 17, 1976

## 2015-11-23 ENCOUNTER — Ambulatory Visit: Payer: BC Managed Care – PPO

## 2015-11-23 DIAGNOSIS — M25511 Pain in right shoulder: Secondary | ICD-10-CM | POA: Diagnosis not present

## 2015-11-23 DIAGNOSIS — M25611 Stiffness of right shoulder, not elsewhere classified: Secondary | ICD-10-CM

## 2015-11-23 DIAGNOSIS — R29898 Other symptoms and signs involving the musculoskeletal system: Secondary | ICD-10-CM

## 2015-11-23 NOTE — Therapy (Signed)
[place] [facility] [address] [place], [place], [address] Phone: [phone]   Fax:  [phone]  Physical Therapy Treatment  Patient Details  Name: Cathy CrockMegan Owens MRN: [id number] Date of Birth: 03/18/1976 No Data Recorded  Encounter Date: 11/23/2015      PT End of Session - 11/23/15 1818    Visit Number 6   Number of Visits 9   Date for PT Re-Evaluation 11/22/15   PT Start Time 1732   PT Stop Time 1815   PT Time Calculation (min) 43 min   Activity Tolerance Patient tolerated treatment well   Behavior During Therapy [facility] for tasks assessed/performed      Past Medical History  Diagnosis Date  . Kidney stones     Past Surgical History  Procedure Laterality Date  . Cholecystectomy  2009  . Index finger surgery      as a child  . Wisdom tooth extraction    . Lithotripsy  2013  . Gallbladder surgery    . Cesarean section N/A 04/29/2015    Procedure: CESAREAN SECTION;  Surgeon: Conard NovakStephen D Jackson, MD;  Location: [facility] ORS;  Service: Obstetrics;  Laterality: N/A;    There were no vitals filed for this visit.  Visit Diagnosis:  Decreased ROM of right shoulder  Weakness of shoulder      Subjective Assessment - 11/23/15 1817    Subjective pt reports her shoulder is feeling a little better.    Patient Stated Goals improve function and decrease pain    Currently in Pain? Yes   Pain Score 1    Pain Location --  R shoulder             ionto:  Skin prep with ETOH swab. ionto STAT patch applied to R biceps tendon With 4mg /ml Dexamethasone. Pt instructed on wear time, potential skin irritations and precautions   Manual therapy: Soft tissue massage to biceps , triceps and deltoid Cross friction massage to to biceps tendon Soft tissue massage to R deltoid Ischemic trigger point release to biceps and deltoid Rotational and CPA T spine mobs grade 3 T1-12 1 bout of 30s each Mobilization with  movement into shoulder Ir/ER with posterior GH glide grade 3  Therex: Prone shoulder extension and horiz abduction 2x10 each Shoulder IR with Tband yellow 2x10 Shoulder low row with yellow band 3x10 Wall slide with lift off 2 x 10 Pt requires min verbal and tactile cues for proper exercise performance                      PT Education - 11/23/15 1817    Education provided Yes   Education Details PROGRESSed HEP including IR with Tband   Person(s) Educated Patient   Methods Explanation   Comprehension Verbalized understanding             PT Long Term Goals - 10/25/15 1909    PT LONG TERM GOAL #1   Title pt's quick DASH score will improve by at least 8 ponts indicating improved physical function and symptoms of L shoulder    Baseline 66.6% on 11/08   Time 4   Period Weeks   PT LONG TERM GOAL #2   Title pt's left shoulder strength will be at least 4/5 in all planes to improve her ability to participate in pilates   Baseline less than 4/5 in all planes and limited by pain on 11/08   Time 4   Period  Weeks   PT LONG TERM GOAL #3   Title pt's fuctional flexion ROM will improve to at least 120 degrees to reach for dishes in top counter with less than 2/10   Baseline 90 degrees of AROM in sitting with 5/10 pain    Time 4   Period Weeks   Status New               Plan - 11/23/15 1818    Clinical Impression Statement pt continues to respond well with gradually decreasing shoulder pain. still needs min to mod cues for new exercise performance. improved T spine mobility noted.    Pt will benefit from skilled therapeutic intervention in order to improve on the following deficits Postural dysfunction;Decreased strength;Hypomobility;Impaired flexibility;Pain;Impaired UE functional use   Rehab Potential Fair   PT Frequency 2x / week   PT Duration 4 weeks   PT Treatment/Interventions ADLs/Self Care Home Management;Aquatic Therapy;Cryotherapy;Electrical  Stimulation;Moist Heat;Iontophoresis /ml Dexamethasone;Therapeutic exercise;Therapeutic activities;Neuromuscular re-education;Manual techniques;Dry needling;Passive range of motion   PT Next Visit Plan postural education         Problem List Patient Active Problem List   Diagnosis Date Noted  . S/P primary low transverse C-section 04/29/2015  . Vaginal bleeding in pregnancy 04/28/2015  . Marginal placenta 04/28/2015  . Low lying placenta with hemorrhage in third trimester, antepartum 04/15/2015  . Sternal contusion 12/22/2014    Cathy Owens 11/23/2015, 6:19 PM  [place] [facility] MAIN [facility] SERVICES [address] [place], [place], [address] Phone: [phone]   Fax:  [phone]  Name: Cathy Owens MRN: [id number] Date of Birth: 06/30/1976

## 2015-11-28 ENCOUNTER — Ambulatory Visit: Payer: BC Managed Care – PPO

## 2015-11-28 DIAGNOSIS — M25511 Pain in right shoulder: Secondary | ICD-10-CM | POA: Diagnosis not present

## 2015-11-28 DIAGNOSIS — R29898 Other symptoms and signs involving the musculoskeletal system: Secondary | ICD-10-CM

## 2015-11-28 DIAGNOSIS — M25611 Stiffness of right shoulder, not elsewhere classified: Secondary | ICD-10-CM

## 2015-11-29 NOTE — Therapy (Signed)
[place] MAIN [facility] SERVICES [address] [place], [place], [address] Phone: [phone]   Fax:  [phone]  Physical Therapy Treatment  Patient Details  Name: Cathy Owens MRN: [id number] Date of Birth: [birth year] No Data Recorded  Encounter Date: 11/28/2015      PT End of Session - 11/29/15 9381    Visit Number 7   Number of Visits 9   Date for PT Re-Evaluation 11/22/15   PT Start Time 1732   PT Stop Time 1820   PT Time Calculation (min) 48 min   Activity Tolerance Patient tolerated treatment well   Behavior During Therapy [facility] for tasks assessed/performed      Past Medical History  Diagnosis Date  . Kidney stones     Past Surgical History  Procedure Laterality Date  . Cholecystectomy  2009  . Index finger surgery      as a child  . Wisdom tooth extraction    . Lithotripsy  2013  . Gallbladder surgery    . Cesarean section N/A 04/29/2015    Procedure: CESAREAN SECTION;  Surgeon: Will Bonnet, MD;  Location: [facility] ORS;  Service: Obstetrics;  Laterality: N/A;    There were no vitals filed for this visit.  Visit Diagnosis:  Decreased ROM of right shoulder  Weakness of shoulder      Subjective Assessment - 11/29/15 0808    Subjective pt reprots her shoulder is feeling a lot better. she is able to reach forward with much less pain. she still has difficulty reaching behind her and scooting up in bed (sitting).    Patient Stated Goals improve function and decrease pain    Currently in Pain? Yes   Pain Score 2    Pain Location --  anterior shoulder       ionto:  Skin prep with ETOH swab. ionto 12 hr patch applied to R biceps tendon With 70m/ml Dexamethasone. Pt instructed on wear time, potential skin irritations and precautions   Manual therapy:  Soft tissue massage to biceps  and deltoid Cross friction massage to to biceps tendon Soft tissue massage to R deltoid Ischemic trigger point release  to biceps and deltoid Rotational and CPA T spine mobs grade 3 T1-12 1 bout of 30s each Mobilization with movement into shoulder Ir/ER with posterior GH glide grade 3  Therex: Triceps extension red band 2x10 Biceps curls yellow band 2x10 Supine serratus punch - yellow band 2x10 Supine shoulder horiz abduction yellow band 2x10 D2 shoulder flexion 0lbs 2x10 D1 shoulder extension yellow band 2x10 Wall slide with lift off 2 x 10 Pt requires min - mod verbal and tactile cues for proper exercise performance                                PT Education - 11/29/15 0811    Education provided Yes   Education Details progressed HEP with handouts   Person(s) Educated Patient   Methods Explanation   Comprehension Returned demonstration;Verbal cues required;Verbalized understanding             PT Long Term Goals - 11/29/15 0818    PT LONG TERM GOAL #1   Title pt's quick DASH score will improve by at least 8 ponts indicating improved physical function and symptoms of L shoulder    Baseline 66.6% on 11/08    45%   Time 4   Period Weeks   Status  Partially Met   PT LONG TERM GOAL #2   Title pt's left shoulder strength will be at least 4/5 in all planes to improve her ability to participate in pilates   Baseline less than 4/5 in all planes and limited by pain on 11/08   Time 4   Period Weeks   Status Partially Met   PT LONG TERM GOAL #3   Title pt's fuctional flexion ROM will improve to at least 120 degrees to reach for dishes in top counter with less than 2/10   Baseline 90 degrees of AROM in sitting with 5/10 pain    Time 4   Period Weeks   Status Achieved               Plan - 11/29/15 8338    Clinical Impression Statement pt did pretty well with progression of HEP/therex. she does need min-mod verbal and tactile cues for scapular control during exercises. PT asked to try new therex tomorrow and return with any questions next visit. She will be  out of town for the next 2 weeks.    Pt will benefit from skilled therapeutic intervention in order to improve on the following deficits Postural dysfunction;Decreased strength;Hypomobility;Impaired flexibility;Pain;Impaired UE functional use   Rehab Potential Fair   PT Frequency 2x / week   PT Duration 4 weeks   PT Treatment/Interventions ADLs/Self Care Home Management;Aquatic Therapy;Cryotherapy;Electrical Stimulation;Moist Heat;Iontophoresis 81m/ml Dexamethasone;Therapeutic exercise;Therapeutic activities;Neuromuscular re-education;Manual techniques;Dry needling;Passive range of motion   PT Next Visit Plan postural education         Problem List Patient Active Problem List   Diagnosis Date Noted  . S/P primary low transverse C-section 04/29/2015  . Vaginal bleeding in pregnancy 04/28/2015  . Marginal placenta 04/28/2015  . Low lying placenta with hemorrhage in third trimester, antepartum 04/15/2015  . Sternal contusion 12/22/2014   AGorden Harms Douglas Rooks, PT, DPT #[id number] Jolie Strohecker 11/29/2015, 8:23 AM  CP[place]MAIN [facility]Med[facility] CenterSERVICES [address] [address]Phone: [phone]  Fax:  [phone] Name: MAnalysa NuttingMRN: [id number] of Birth: 303-Nov-1977

## 2015-11-29 NOTE — Addendum Note (Signed)
Addended by: Abelardo DieselTORICI, Moody Robben C on: 11/29/2015 08:21 AM   Modules accepted: Orders

## 2015-11-30 ENCOUNTER — Ambulatory Visit: Payer: BC Managed Care – PPO

## 2015-11-30 DIAGNOSIS — M25611 Stiffness of right shoulder, not elsewhere classified: Secondary | ICD-10-CM

## 2015-11-30 DIAGNOSIS — M25511 Pain in right shoulder: Secondary | ICD-10-CM | POA: Diagnosis not present

## 2015-11-30 DIAGNOSIS — R29898 Other symptoms and signs involving the musculoskeletal system: Secondary | ICD-10-CM

## 2015-11-30 NOTE — Therapy (Signed)
[place] MAIN [facility] SERVICES [address] [place], [place], [address] Phone: [phone]   Fax:  [phone]  Physical Therapy Treatment  Patient Details  Name: Cathy Owens MRN: [id number] Date of Birth: 05-29-1976 No Data Recorded  Encounter Date: 11/30/2015      PT End of Session - 11/30/15 1838    Visit Number 8   Number of Visits 9   Date for PT Re-Evaluation 11/22/15   PT Start Time 6433   PT Stop Time 1821   PT Time Calculation (min) 50 min   Activity Tolerance Patient tolerated treatment well   Behavior During Therapy [facility] for tasks assessed/performed      Past Medical History  Diagnosis Date  . Kidney stones     Past Surgical History  Procedure Laterality Date  . Cholecystectomy  2009  . Index finger surgery      as a child  . Wisdom tooth extraction    . Lithotripsy  2013  . Gallbladder surgery    . Cesarean section N/A 04/29/2015    Procedure: CESAREAN SECTION;  Surgeon: Will Bonnet, MD;  Location: [facility] ORS;  Service: Obstetrics;  Laterality: N/A;    There were no vitals filed for this visit.  Visit Diagnosis:  Decreased ROM of right shoulder  Weakness of shoulder      Subjective Assessment - 11/30/15 1844    Subjective Patient continues to report pain with forward flexion to 90 degrees and putting on a seat belt. She notes reaching forward is less painful.   Limitations Lifting   Patient Stated Goals improve function and decrease pain    Currently in Pain? Yes  States pain increases with active movement, none at rest.    Pain Location --  Anterior shoulder   Pain Orientation Right   Pain Descriptors / Indicators Aching   Aggravating Factors  Overhead movements          Soft tissue mobilization to long head of biceps and posterior rotator cuff. Patient reported decreased symptoms with sidelying shoulder flexion.   Red t-band ER bilaterally in 0 degrees flexion (challenging) for 10 repetitions  for 2 sets  Chest Press with 3# DB for serratus recruitment/strengthening for 8 repetitions for 2 sets.   Prone horizontal abductions initially with 2# DB (unable to tolerate) opted for bodyweight for 2 sets of 10  Prone extensions with 2# DB x 10 repetitions for 2 sets (activation of LT noted)   Superior-inferior GH mobilizations grade I throughout range, no increase in symptoms   ** Patient continued to note pain at 90 degrees with shoulder flexion, no pain with passive shoulder movement.  Standing cable rows with 7.5# bilaterally 2 sets x 8 repetitions (stated she was "aware" of pain in anterior R shoulder) -- PT noted humeral extension with subsequent anterior translation of humeral head. Likely resulting in irritation of the long head of the biceps. Attempted to educate patient with yellow t-band pull aparts and cable rows with tactile cuing through scapula to retract (she finds this difficult to complete volitionally).   Low Rows with yellow t-band and 5" holds (began to feel scapular retraction). X 10 for 2 sets  Weighted carry with 5# DB, no increase in symptoms (to activate cuff and encourage inferior glide of humeral head).                         PT Education - 11/30/15 1838  Education provided Yes   Education Details Increased HEP and educated patient on correct shoulder mechanics and sources of irritation.    Person(s) Educated Patient   Methods Explanation;Demonstration;Handout;Tactile cues;Verbal cues   Comprehension Returned demonstration;Verbalized understanding;Tactile cues required;Verbal cues required;Need further instruction             PT Long Term Goals - 11/29/15 0818    PT LONG TERM GOAL #1   Title pt's quick DASH score will improve by at least 8 ponts indicating improved physical function and symptoms of L shoulder    Baseline 66.6% on 11/08    45%   Time 4   Period Weeks   Status Partially Met   PT LONG TERM GOAL #2   Title pt's  left shoulder strength will be at least 4/5 in all planes to improve her ability to participate in pilates   Baseline less than 4/5 in all planes and limited by pain on 11/08   Time 4   Period Weeks   Status Partially Met   PT LONG TERM GOAL #3   Title pt's fuctional flexion ROM will improve to at least 120 degrees to reach for dishes in top counter with less than 2/10   Baseline 90 degrees of AROM in sitting with 5/10 pain    Time 4   Period Weeks   Status Achieved               Plan - 11/30/15 1839    Clinical Impression Statement Patient continues to report pain at 90 degrees flexion and above in the anterior portion of her shoulder, near pec minor. It appears this is contributed by poor pulling mechanics (humeral extension and anterior humeral head translation rather than scapular retraction). She denotes difficulty activating her scapular retractors and requires hands on cuing to do so. Patient would benefit from additional strengthening targeting middle and lower traps as well as posterior cuff.    Pt will benefit from skilled therapeutic intervention in order to improve on the following deficits Postural dysfunction;Decreased strength;Hypomobility;Impaired flexibility;Pain;Impaired UE functional use   Rehab Potential Fair   PT Frequency 2x / week   PT Duration 4 weeks   PT Treatment/Interventions ADLs/Self Care Home Management;Aquatic Therapy;Cryotherapy;Electrical Stimulation;Moist Heat;Iontophoresis 56m/ml Dexamethasone;Therapeutic exercise;Therapeutic activities;Neuromuscular re-education;Manual techniques;Dry needling;Passive range of motion   PT Next Visit Plan postural education    PT Home Exercise Plan Weighted carry and Low Rows    Consulted and Agree with Plan of Care Patient        Problem List Patient Active Problem List   Diagnosis Date Noted  . S/P primary low transverse C-section 04/29/2015  . Vaginal bleeding in pregnancy 04/28/2015  . Marginal placenta  04/28/2015  . Low lying placenta with hemorrhage in third trimester, antepartum 04/15/2015  . Sternal contusion 12/22/2014   PKerman Passey PT, DPT    11/30/2015, 6:46 PM  CGardenMAIN RBeverly HospitalSERVICES [address]unse[address] [address]Phone: [phone]  Fax:  [phone] Name: MWanna GullyMRN: [id number] of Birth: 309-Dec-1977

## 2015-12-20 ENCOUNTER — Ambulatory Visit: Payer: BC Managed Care – PPO | Attending: Student

## 2015-12-20 DIAGNOSIS — M25511 Pain in right shoulder: Secondary | ICD-10-CM | POA: Diagnosis not present

## 2015-12-20 DIAGNOSIS — R29898 Other symptoms and signs involving the musculoskeletal system: Secondary | ICD-10-CM | POA: Insufficient documentation

## 2015-12-20 DIAGNOSIS — M25611 Stiffness of right shoulder, not elsewhere classified: Secondary | ICD-10-CM

## 2015-12-21 NOTE — Therapy (Signed)
[place] MAIN [facility] SERVICES [address] [place], [place], [address] Phone: [phone]   Fax:  [phone]  Physical Therapy Treatment  Patient Details  Name: Cathy Owens MRN: [id number] Date of Birth: 24-Feb-1976 No Data Recorded  Encounter Date: 12/20/2015      PT End of Session - 12/21/15 0845    Visit Number 9   Number of Visits 17   Date for PT Re-Evaluation 01/18/16   PT Start Time 2671   PT Stop Time 1820   PT Time Calculation (min) 45 min   Activity Tolerance Patient tolerated treatment well   Behavior During Therapy [facility] for tasks assessed/performed      Past Medical History  Diagnosis Date  . Kidney stones     Past Surgical History  Procedure Laterality Date  . Cholecystectomy  2009  . Index finger surgery      as a child  . Wisdom tooth extraction    . Lithotripsy  2013  . Gallbladder surgery    . Cesarean section N/A 04/29/2015    Procedure: CESAREAN SECTION;  Surgeon: Will Bonnet, MD;  Location: [facility] ORS;  Service: Obstetrics;  Laterality: N/A;    There were no vitals filed for this visit.  Visit Diagnosis:  Decreased ROM of right shoulder - Plan: PT plan of care cert/re-cert  Weakness of shoulder - Plan: PT plan of care cert/re-cert      Subjective Assessment - 12/20/15 1739    Subjective pt reports overall she feels her pain is 50% better and her use of her shoulde is 75% better. she has been compliant with HEP, but doesnt feel she has made much progress in the past 2 weeks.    Limitations Lifting   Patient Stated Goals improve function and decrease pain    Currently in Pain? Yes   Pain Score 2    Pain Location --  R shoulder        Manual therapy: T1-T10 CPA glides grade 3 3 bouts of 15s each level. Pt tender T4-10 as well as hypomobile Soft tissue massage to R biceps and anterior deltoid. Ischemic trigger point release to R anterior deltoid  Therex: Prone shoulder horizontal abduction  3x10 with mod cues verbal and tactile for posture and scap retraction Prone shoulder flexion "Y"s 3x10 same cues as above Supine serratus punch 3x10 0lbs Shoulder D2 extension 3x10 red band with mod verbal and tactile cues for scap retraction.    Skin prep with ETOH swab. ionto STAT patch applied to          R biceps tendon          With 74m/ml Dexamethasone. Pt instructed on wear time, potential skin irritations and precautions                             PT Education - 12/21/15 0844    Education provided Yes   Education Details progress towards goals. tactile cues for scapular retraction    Person(s) Educated Patient   Methods Explanation   Comprehension Verbalized understanding             PT Long Term Goals - 12/21/15 0847    PT LONG TERM GOAL #1   Title pt's quick DASH score will improve by at least 8 ponts indicating improved physical function and symptoms of L shoulder    Baseline 66.6% on 11/08    45%   Time  4   Period Weeks   Status Partially Met   PT LONG TERM GOAL #2   Title pt's left shoulder strength will be at least 4/5 in all planes to improve her ability to participate in pilates   Baseline less than 4/5 in all planes and limited by pain on 11/08   Time 4   Period Weeks   Status Achieved   PT LONG TERM GOAL #3   Title pt's fuctional flexion ROM will improve to at least 120 degrees to reach for dishes in top counter with less than 2/10   Baseline 90 degrees of AROM in sitting with 5/10 pain    Time 4   Period Weeks   Status Achieved   PT LONG TERM GOAL #4   Title pt will be able to place 3lb weight overhead x 5 with proper shoulder mechanics and pain <3/10   Time 4   Period Weeks   Status New   PT LONG TERM GOAL #5   Title pt will be able to put her seatbelt on with pain <3/10   Time 4   Period Weeks   Status New   Additional Long Term Goals   Additional Long Term Goals Yes   PT LONG TERM GOAL #6   Title pt will be able to be  independent with progressed HEP for continued strengthening after DC   Time 4   Period Weeks   Status New               Plan - 12/21/15 0845    Clinical Impression Statement pt is making gradual, but good progress towards goals. she has nearly full shoulder ROM at this time, but is still limited in abduction and flexion by pain. she has make progress with her strength as well. pt demonstrated much reduced pain and improved shoulder ROM with less pain at end of session today likely due to improved lower trap activation and improved T spine mobility. pt would benefit from continued skilled PT services to continue to address pain, shoulder ROM, strength to maximize function.    Pt will benefit from skilled therapeutic intervention in order to improve on the following deficits Postural dysfunction;Decreased strength;Hypomobility;Impaired flexibility;Pain;Impaired UE functional use   Rehab Potential Fair   PT Frequency 2x / week   PT Duration 4 weeks   PT Treatment/Interventions ADLs/Self Care Home Management;Aquatic Therapy;Cryotherapy;Electrical Stimulation;Moist Heat;Iontophoresis 4mg/ml Dexamethasone;Therapeutic exercise;Therapeutic activities;Neuromuscular re-education;Manual techniques;Dry needling;Passive range of motion   Consulted and Agree with Plan of Care Patient        Problem List Patient Active Problem List   Diagnosis Date Noted  . S/P primary low transverse C-section 04/29/2015  . Vaginal bleeding in pregnancy 04/28/2015  . Marginal placenta 04/28/2015  . Low lying placenta with hemorrhage in third trimester, antepartum 04/15/2015  . Sternal contusion 12/22/2014   Ashley C. Tortorici, PT, DPT #[id number]  Tortorici,Ashley 12/21/2015, 8:53 AM  [place] [facility] REHAB SERVICES [address] [place], [place], [address] Phone: [phone]   Fax:  [phone]  Name: Cathy Owens MRN: [id number] Date of Birth: 11/10/1976     

## 2015-12-26 ENCOUNTER — Ambulatory Visit: Payer: BC Managed Care – PPO

## 2015-12-26 DIAGNOSIS — M25511 Pain in right shoulder: Secondary | ICD-10-CM | POA: Diagnosis not present

## 2015-12-26 DIAGNOSIS — M25611 Stiffness of right shoulder, not elsewhere classified: Secondary | ICD-10-CM

## 2015-12-26 DIAGNOSIS — R29898 Other symptoms and signs involving the musculoskeletal system: Secondary | ICD-10-CM

## 2015-12-27 NOTE — Therapy (Signed)
[place] MAIN [facility] SERVICES [address] [place], [place], [address] Phone: [phone]   Fax:  [phone]  Physical Therapy Treatment  Patient Details  Name: Cathy Owens MRN: [id number] Date of Birth: 03-Mar-1976 No Data Recorded  Encounter Date: 12/26/2015      PT End of Session - 12/27/15 0858    Visit Number 10   Number of Visits 17   Date for PT Re-Evaluation 01/18/16   PT Start Time 1732   PT Stop Time 1825   PT Time Calculation (min) 53 min   Activity Tolerance Patient tolerated treatment well   Behavior During Therapy [facility] for tasks assessed/performed      Past Medical History  Diagnosis Date  . Kidney stones     Past Surgical History  Procedure Laterality Date  . Cholecystectomy  2009  . Index finger surgery      as a child  . Wisdom tooth extraction    . Lithotripsy  2013  . Gallbladder surgery    . Cesarean section N/A 04/29/2015    Procedure: CESAREAN SECTION;  Surgeon: Will Bonnet, MD;  Location: [facility] ORS;  Service: Obstetrics;  Laterality: N/A;    There were no vitals filed for this visit.  Visit Diagnosis:  Decreased ROM of right shoulder  Weakness of shoulder      Subjective Assessment - 12/27/15 0856    Subjective pt reports she thinks she strained a neck muscle at last PT treatment. she reports her shoulder has also been hurting more.    Limitations Lifting   Patient Stated Goals improve function and decrease pain    Currently in Pain? Yes   Pain Score 4    Pain Location --  R SCM nad R shoulder      manual therapy: Soft tissue massage to R scalene, SCM and upper trap. Effleurage, and Ischemic trigger point release   Therex: Prone shoulder horiz abduction with ER 0lbs 3x10 (head supported on table) mod cues to scapular retraction/depression, reducing shoulder shrug Prone shoulder flexion "Y" 3x10 mod verbal and tactile cues to scapular retraction/depression, reducing shoulder  shrug Modified full plank on incline 1 min x 3 Serratus punch 3lbs 2x15 sidelying shoulder flexion to 90 deg pt needs [id number] cues for scapular retraction/positioning. 3x10                           PT Education - 12/27/15 0857    Education provided Yes   Education Details importance of periscapular strengthening for proper mechanics and to reduce shoulder pain.    Person(s) Educated Patient   Methods Explanation;Demonstration;Tactile cues;Verbal cues   Comprehension Verbal cues required;Tactile cues required;Need further instruction             PT Long Term Goals - 12/21/15 0847    PT LONG TERM GOAL #1   Title pt's quick DASH score will improve by at least 8 ponts indicating improved physical function and symptoms of L shoulder    Baseline 66.6% on 11/08    45%   Time 4   Period Weeks   Status Partially Met   PT LONG TERM GOAL #2   Title pt's left shoulder strength will be at least 4/5 in all planes to improve her ability to participate in pilates   Baseline less than 4/5 in all planes and limited by pain on 11/08   Time 4   Period Weeks   Status Achieved  PT LONG TERM GOAL #3   Title pt's fuctional flexion ROM will improve to at least 120 degrees to reach for dishes in top counter with less than 2/10   Baseline 90 degrees of AROM in sitting with 5/10 pain    Time 4   Period Weeks   Status Achieved   PT LONG TERM GOAL #4   Title pt will be able to place 3lb weight overhead x 5 with proper shoulder mechanics and pain <3/10   Time 4   Period Weeks   Status New   PT LONG TERM GOAL #5   Title pt will be able to put her seatbelt on with pain <3/10   Time 4   Period Weeks   Status New   Additional Long Term Goals   Additional Long Term Goals Yes   PT LONG TERM GOAL #6   Title pt will be able to be independent with progressed HEP for continued strengthening after DC   Time 4   Period Weeks   Status New               Plan - 12/27/15  9150    Clinical Impression Statement pt demonstrates increased R SCM tone/pain, with unknown cause. pt did have to perfrom cervical retraction against gravity last session, however this should not have caused this pain. pts pain was relieved with soft tissue massage and she was instructed on SCM stretch. pt conitnues to demonstrate poor scapular control/ and poor body awareness needing mod-max tactile and verbal cues for proper scapular control. once this is achived pt verbalizes reduced pain with requested exercises. pt still needing mod cues for proeper upright posture both with static sitting and with Ue movement. Pt would benefit from continued skilled PT services to improve shoulder and periscapular strength to reduce pain and correct shoulder mechanics.    Pt will benefit from skilled therapeutic intervention in order to improve on the following deficits Postural dysfunction;Decreased strength;Hypomobility;Impaired flexibility;Pain;Impaired UE functional use   Rehab Potential Fair   PT Frequency 2x / week   PT Duration 4 weeks   PT Treatment/Interventions ADLs/Self Care Home Management;Aquatic Therapy;Cryotherapy;Electrical Stimulation;Moist Heat;Iontophoresis 48m/ml Dexamethasone;Therapeutic exercise;Therapeutic activities;Neuromuscular re-education;Manual techniques;Dry needling;Passive range of motion   Consulted and Agree with Plan of Care Patient        Problem List Patient Active Problem List   Diagnosis Date Noted  . S/P primary low transverse C-section 04/29/2015  . Vaginal bleeding in pregnancy 04/28/2015  . Marginal placenta 04/28/2015  . Low lying placenta with hemorrhage in third trimester, antepartum 04/15/2015  . Sternal contusion 12/22/2014   Cathy Owens, PT, DPT #[id number] Cathy Owens 12/27/2015, 9:04 AM  CSelmaMAIN RResurgens Surgery Center LLCSERVICES [address] [place] [address]Phone: [phone]  Fax:   [phone] Name: Cathy WerntzMRN: [id number] of Birth: 302/20/1977

## 2015-12-28 ENCOUNTER — Ambulatory Visit: Payer: BC Managed Care – PPO

## 2015-12-28 DIAGNOSIS — M25511 Pain in right shoulder: Secondary | ICD-10-CM | POA: Diagnosis not present

## 2015-12-28 DIAGNOSIS — R29898 Other symptoms and signs involving the musculoskeletal system: Secondary | ICD-10-CM

## 2015-12-28 NOTE — Therapy (Signed)
[place] MAIN [facility] SERVICES [address] [place], [place], [address] Phone: [phone]   Fax:  [phone]  Physical Therapy Treatment  Patient Details  Name: Cathy Owens MRN: [id number] Date of Birth: [birth year] No Data Recorded  Encounter Date: 12/28/2015      PT End of Session - 12/28/15 1818    Visit Number 11   Number of Visits 17   Date for PT Re-Evaluation 01/18/16   PT Start Time 1745   PT Stop Time 1815   PT Time Calculation (min) 30 min   Activity Tolerance Patient tolerated treatment well   Behavior During Therapy [facility] for tasks assessed/performed      Past Medical History  Diagnosis Date  . Kidney stones     Past Surgical History  Procedure Laterality Date  . Cholecystectomy  2009  . Index finger surgery      as a child  . Wisdom tooth extraction    . Lithotripsy  2013  . Gallbladder surgery    . Cesarean section N/A 04/29/2015    Procedure: CESAREAN SECTION;  Surgeon: Will Bonnet, MD;  Location: [facility] ORS;  Service: Obstetrics;  Laterality: N/A;    There were no vitals filed for this visit.  Visit Diagnosis:  Weakness of shoulder      Subjective Assessment - 12/28/15 1817    Subjective pt reports her neck is much better. pt reports her shoulder is continuing to slowly improve.    Limitations Lifting   Patient Stated Goals improve function and decrease pain    Currently in Pain? Yes   Pain Score 2    Pain Orientation --  R shoulder pain       Therex: UBE x 2 min no charge Wall slide into Y with lift off cues to activate lower trap 3x10 D1 shoulder extension red band 3x10 D2 shoulder flexion 1lb 3x5 Mid rows on cable column 7.5lbs 3x10 Wall push up with a + 2x10 Shoulder IR/ER yellow band 3x10 Overhead press AAROM with PVC pipe 3x5 in mirror for visual cues to reduce shoulder shrug on the R Pt requires min -mod verbal and tactile cues for proper exercise performance                               PT Education - 12/28/15 1818    Education provided Yes   Education Details progressed HEP   Person(s) Educated Patient   Methods Explanation   Comprehension Verbalized understanding             PT Long Term Goals - 12/21/15 0847    PT LONG TERM GOAL #1   Title pt's quick DASH score will improve by at least 8 ponts indicating improved physical function and symptoms of L shoulder    Baseline 66.6% on 11/08    45%   Time 4   Period Weeks   Status Partially Met   PT LONG TERM GOAL #2   Title pt's left shoulder strength will be at least 4/5 in all planes to improve her ability to participate in pilates   Baseline less than 4/5 in all planes and limited by pain on 11/08   Time 4   Period Weeks   Status Achieved   PT LONG TERM GOAL #3   Title pt's fuctional flexion ROM will improve to at least 120 degrees to reach for dishes in top counter with less than  2/10   Baseline 90 degrees of AROM in sitting with 5/10 pain    Time 4   Period Weeks   Status Achieved   PT LONG TERM GOAL #4   Title pt will be able to place 3lb weight overhead x 5 with proper shoulder mechanics and pain <3/10   Time 4   Period Weeks   Status New   PT LONG TERM GOAL #5   Title pt will be able to put her seatbelt on with pain <3/10   Time 4   Period Weeks   Status New   Additional Long Term Goals   Additional Long Term Goals Yes   PT LONG TERM GOAL #6   Title pt will be able to be independent with progressed HEP for continued strengthening after DC   Time 4   Period Weeks   Status New               Plan - 12/28/15 1819    Clinical Impression Statement pt responded well to [facility] and stretching to the SCM last session and has no more pain. progressed therex today and included over head exercises with focus on reducing shoulder shrug. pt is slowly making gains in strength and progressing towards goals    Pt will benefit from skilled  therapeutic intervention in order to improve on the following deficits Postural dysfunction;Decreased strength;Hypomobility;Impaired flexibility;Pain;Impaired UE functional use   Rehab Potential Fair   PT Frequency 2x / week   PT Duration 4 weeks   PT Treatment/Interventions ADLs/Self Care Home Management;Aquatic Therapy;Cryotherapy;Electrical Stimulation;Moist Heat;Iontophoresis 36m/ml Dexamethasone;Therapeutic exercise;Therapeutic activities;Neuromuscular re-education;Manual techniques;Dry needling;Passive range of motion   Consulted and Agree with Plan of Care Patient        Problem List Patient Active Problem List   Diagnosis Date Noted  . S/P primary low transverse C-section 04/29/2015  . Vaginal bleeding in pregnancy 04/28/2015  . Marginal placenta 04/28/2015  . Low lying placenta with hemorrhage in third trimester, antepartum 04/15/2015  . Sternal contusion 12/22/2014   AGorden Harms Brittin Belnap, PT, DPT #[phone] Fr[address] Basnett 12/28/2015, 6:21 PM  CPine AppleMAIN [facility] HospitalSERVICES [address] [address]Phone: [phone]  Fax:  [phone] Name: MLen KluverMRN: [id number] of Birth: 304-19-1977

## 2016-01-03 ENCOUNTER — Ambulatory Visit: Payer: BC Managed Care – PPO

## 2016-01-03 DIAGNOSIS — M25511 Pain in right shoulder: Secondary | ICD-10-CM | POA: Diagnosis not present

## 2016-01-03 DIAGNOSIS — M25611 Stiffness of right shoulder, not elsewhere classified: Secondary | ICD-10-CM

## 2016-01-03 DIAGNOSIS — R29898 Other symptoms and signs involving the musculoskeletal system: Secondary | ICD-10-CM

## 2016-01-04 ENCOUNTER — Ambulatory Visit: Payer: BC Managed Care – PPO

## 2016-01-04 DIAGNOSIS — R29898 Other symptoms and signs involving the musculoskeletal system: Secondary | ICD-10-CM

## 2016-01-04 DIAGNOSIS — M25511 Pain in right shoulder: Secondary | ICD-10-CM | POA: Diagnosis not present

## 2016-01-04 DIAGNOSIS — M25611 Stiffness of right shoulder, not elsewhere classified: Secondary | ICD-10-CM

## 2016-01-04 NOTE — Therapy (Signed)
[place] MAIN [facility] SERVICES [address] [place], [place], [address] Phone: [phone]   Fax:  [phone]  Physical Therapy Treatment  Patient Details  Name: Cathy Owens MRN: [id number] Date of Birth: January 28, 1976 No Data Recorded  Encounter Date: 01/03/2016      PT End of Session - 01/04/16 1541    Visit Number 12   Number of Visits 17   Date for PT Re-Evaluation 01/18/16   PT Start Time 3646   PT Stop Time 1825   PT Time Calculation (min) 55 min   Activity Tolerance Patient tolerated treatment well   Behavior During Therapy [facility] for tasks assessed/performed      Past Medical History  Diagnosis Date  . Kidney stones     Past Surgical History  Procedure Laterality Date  . Cholecystectomy  2009  . Index finger surgery      as a child  . Wisdom tooth extraction    . Lithotripsy  2013  . Gallbladder surgery    . Cesarean section N/A 04/29/2015    Procedure: CESAREAN SECTION;  Surgeon: Will Bonnet, MD;  Location: [facility] ORS;  Service: Obstetrics;  Laterality: N/A;    There were no vitals filed for this visit.  Visit Diagnosis:  Weakness of shoulder  Decreased ROM of right shoulder      Subjective Assessment - 01/04/16 1539    Subjective pt reports her lower back hurts, but has a kidney stone. pt reports her shoulder is not hurting very much   Limitations Lifting   Patient Stated Goals improve function and decrease pain    Currently in Pain? Yes   Pain Score 4    Pain Location --  lower back        Wall walk with lift off to activate lower trap, yellow band 3x5 D1 shoulder extension red band 3x10 D2 shoulder flexion 1lb 3x5 Mid rows on cable column 7.5lbs 3x10 Wall push up with a + 2x10 Shoulder IR/ER yellow band 3x10 Overhead press AAROM with PVC pipe 4x5 in mirror for visual cues to reduce shoulder shrug on the R Lat pull down 7.5lbs 3x10 Pt requires min verbal and tactile cues for proper exercise  performance                                     PT Education - 01/04/16 1541    Education provided Yes   Education Details continuing to minimize scapular elevation on th eR   Person(s) Educated Patient   Methods Explanation             PT Long Term Goals - 12/21/15 0847    PT LONG TERM GOAL #1   Title pt's quick DASH score will improve by at least 8 ponts indicating improved physical function and symptoms of L shoulder    Baseline 66.6% on 11/08    45%   Time 4   Period Weeks   Status Partially Met   PT LONG TERM GOAL #2   Title pt's left shoulder strength will be at least 4/5 in all planes to improve her ability to participate in pilates   Baseline less than 4/5 in all planes and limited by pain on 11/08   Time 4   Period Weeks   Status Achieved   PT LONG TERM GOAL #3   Title pt's fuctional flexion ROM will improve to at  least 120 degrees to reach for dishes in top counter with less than 2/10   Baseline 90 degrees of AROM in sitting with 5/10 pain    Time 4   Period Weeks   Status Achieved   PT LONG TERM GOAL #4   Title pt will be able to place 3lb weight overhead x 5 with proper shoulder mechanics and pain <3/10   Time 4   Period Weeks   Status New   PT LONG TERM GOAL #5   Title pt will be able to put her seatbelt on with pain <3/10   Time 4   Period Weeks   Status New   Additional Long Term Goals   Additional Long Term Goals Yes   PT LONG TERM GOAL #6   Title pt will be able to be independent with progressed HEP for continued strengthening after DC   Time 4   Period Weeks   Status New               Plan - 01/04/16 1542    Clinical Impression Statement pt is progressing well with exercise performance now needing only min cues for proper performance, but does still tend to hike shoulder intermittantly. little pain today, only with over shoulder level motions    Pt will benefit from skilled therapeutic intervention in  order to improve on the following deficits Postural dysfunction;Decreased strength;Hypomobility;Impaired flexibility;Pain;Impaired UE functional use   Rehab Potential Fair   PT Frequency 2x / week   PT Duration 4 weeks   PT Treatment/Interventions ADLs/Self Care Home Management;Aquatic Therapy;Cryotherapy;Electrical Stimulation;Moist Heat;Iontophoresis 70m/ml Dexamethasone;Therapeutic exercise;Therapeutic activities;Neuromuscular re-education;Manual techniques;Dry needling;Passive range of motion   Consulted and Agree with Plan of Care Patient        Problem List Patient Active Problem List   Diagnosis Date Noted  . S/P primary low transverse C-section 04/29/2015  . Vaginal bleeding in pregnancy 04/28/2015  . Marginal placenta 04/28/2015  . Low lying placenta with hemorrhage in third trimester, antepartum 04/15/2015  . Sternal contusion 12/22/2014   AGorden Harms Jobe Mutch, PT, DPT #[id number] Cathy Owens 01/04/2016, 3:43 PM  CGreens LandingMAIN RFill[facility] HospitalSERVICES [address] [place] [address]Phone: [phone]  Fax:  [phone] Name: Cathy NierMRN: [id number] of Birth: [birth year]

## 2016-01-04 NOTE — Therapy (Signed)
[place] MAIN [facility] SERVICES [address] [place], [place], [address] Phone: [phone]   Fax:  [phone]  Physical Therapy Treatment  Patient Details  Name: Cathy Owens MRN: [id number] Date of Birth: 28-Jan-1976 No Data Recorded  Encounter Date: 01/04/2016      PT End of Session - 01/04/16 1826    Visit Number 13   Number of Visits 17   Date for PT Re-Evaluation 01/18/16   PT Start Time 1025   PT Stop Time 1815   PT Time Calculation (min) 45 min   Activity Tolerance Patient tolerated treatment well   Behavior During Therapy [facility] for tasks assessed/performed      Past Medical History  Diagnosis Date  . Kidney stones     Past Surgical History  Procedure Laterality Date  . Cholecystectomy  2009  . Index finger surgery      as a child  . Wisdom tooth extraction    . Lithotripsy  2013  . Gallbladder surgery    . Cesarean section N/A 04/29/2015    Procedure: CESAREAN SECTION;  Surgeon: Will Bonnet, MD;  Location: [facility] ORS;  Service: Obstetrics;  Laterality: N/A;    There were no vitals filed for this visit.  Visit Diagnosis:  Weakness of shoulder  Decreased ROM of right shoulder      Subjective Assessment - 01/04/16 1825    Subjective pt reports her shoulder is feeling pretty good. her back feels better today also   Limitations Lifting   Patient Stated Goals improve function and decrease pain    Currently in Pain? No/denies       wall push up 2x10 Wall walk up with yellow band and lift off activating lower trap 2x10 ABC s with UE ranger A-Z Prone W, Ts over Tball 2lbs 2x10 Foam roll lying with shoulder abduction x 2 min Repeated extension over foam roll (Tspine) x 10 Attempted modified plank walk outs- too hard- modified to incline 2x10 Pt requires min-mod verbal and tactile cues for proper exercise performance                            PT Education - 01/04/16 1826    Education  provided Yes   Education Details pelvic floor physical therapy    Person(s) Educated Patient   Methods Explanation   Comprehension Verbalized understanding             PT Long Term Goals - 12/21/15 0847    PT LONG TERM GOAL #1   Title pt's quick DASH score will improve by at least 8 ponts indicating improved physical function and symptoms of L shoulder    Baseline 66.6% on 11/08    45%   Time 4   Period Weeks   Status Partially Met   PT LONG TERM GOAL #2   Title pt's left shoulder strength will be at least 4/5 in all planes to improve her ability to participate in pilates   Baseline less than 4/5 in all planes and limited by pain on 11/08   Time 4   Period Weeks   Status Achieved   PT LONG TERM GOAL #3   Title pt's fuctional flexion ROM will improve to at least 120 degrees to reach for dishes in top counter with less than 2/10   Baseline 90 degrees of AROM in sitting with 5/10 pain    Time 4   Period Weeks  Status Achieved   PT LONG TERM GOAL #4   Title pt will be able to place 3lb weight overhead x 5 with proper shoulder mechanics and pain <3/10   Time 4   Period Weeks   Status New   PT LONG TERM GOAL #5   Title pt will be able to put her seatbelt on with pain <3/10   Time 4   Period Weeks   Status New   Additional Long Term Goals   Additional Long Term Goals Yes   PT LONG TERM GOAL #6   Title pt will be able to be independent with progressed HEP for continued strengthening after DC   Time 4   Period Weeks   Status New               Plan - 01/04/16 1826    Clinical Impression Statement pt did well with progression of therex, however does still demonstrate periscapular weakness and impaired control. pt was better able to self mobilize her T spine with foam roll and is considering one at home.    Pt will benefit from skilled therapeutic intervention in order to improve on the following deficits Postural dysfunction;Decreased  strength;Hypomobility;Impaired flexibility;Pain;Impaired UE functional use   Rehab Potential Fair   PT Frequency 2x / week   PT Duration 4 weeks   PT Treatment/Interventions ADLs/Self Care Home Management;Aquatic Therapy;Cryotherapy;Electrical Stimulation;Moist Heat;Iontophoresis 71m/ml Dexamethasone;Therapeutic exercise;Therapeutic activities;Neuromuscular re-education;Manual techniques;Dry needling;Passive range of motion   Consulted and Agree with Plan of Care Patient        Problem List Patient Active Problem List   Diagnosis Date Noted  . S/P primary low transverse C-section 04/29/2015  . Vaginal bleeding in pregnancy 04/28/2015  . Marginal placenta 04/28/2015  . Low lying placenta with hemorrhage in third trimester, antepartum 04/15/2015  . Sternal contusion 12/22/2014   AGorden Harms Sanaa Zilberman, PT, DPT #[id number] Briellah Baik 01/04/2016, 6:28 PM  CG[place]MAIN [facility] LLCSERVICES [address] [address]Phone: [phone]  Fax:  [phone] Name: Cathy MarinaMRN: [id number] of Birth: [birth year]

## 2016-01-10 ENCOUNTER — Ambulatory Visit: Payer: BC Managed Care – PPO

## 2016-01-10 DIAGNOSIS — M25611 Stiffness of right shoulder, not elsewhere classified: Secondary | ICD-10-CM

## 2016-01-10 DIAGNOSIS — R29898 Other symptoms and signs involving the musculoskeletal system: Secondary | ICD-10-CM

## 2016-01-10 DIAGNOSIS — M25511 Pain in right shoulder: Secondary | ICD-10-CM | POA: Diagnosis not present

## 2016-01-10 NOTE — Patient Instructions (Addendum)
Chin tuck with lift for posture 5s x 10 Doorway stretch 20s x 3 each Issued from [email or web address]

## 2016-01-11 NOTE — Therapy (Signed)
[place] MAIN [facility] SERVICES [address] [place], [place], [address] Phone: [phone]   Fax:  [phone]  Physical Therapy Treatment  Patient Details  Name: Cathy Owens MRN: [id number] Date of Birth: 12-22-1975 No Data Recorded  Encounter Date: 01/10/2016      PT End of Session - 01/11/16 1045    Visit Number 4   Number of Visits 17   Date for PT Re-Evaluation 01/18/16   PT Start Time 6720   PT Stop Time 1830   PT Time Calculation (min) 45 min   Activity Tolerance Patient tolerated treatment well   Behavior During Therapy [facility] for tasks assessed/performed      Past Medical History  Diagnosis Date  . Kidney stones     Past Surgical History  Procedure Laterality Date  . Cholecystectomy  2009  . Index finger surgery      as a child  . Wisdom tooth extraction    . Lithotripsy  2013  . Gallbladder surgery    . Cesarean section N/A 04/29/2015    Procedure: CESAREAN SECTION;  Surgeon: Will Bonnet, MD;  Location: [facility] ORS;  Service: Obstetrics;  Laterality: N/A;    There were no vitals filed for this visit.  Visit Diagnosis:  Decreased ROM of right shoulder  Weakness of shoulder      Subjective Assessment - 01/11/16 1037    Subjective pt reports her shoulder feels about 65% better. she reports little pain, only with overhead motions and motions where she has to reach across her body.   Limitations Lifting   Patient Stated Goals improve function and decrease pain    Currently in Pain? Yes   Pain Score 2    Pain Descriptors / Indicators --  R shoulder     Laying along full foam roll with shoulder horiz abduction and flexion x 3 min Repeated T spine extension over foam roll in supine 2x10 Supine chin tuck with lift 3s x 10 Doorway stretch 20s x 3 Matrix mid rows 7.5lbs 3x10 Cross over D1 extension on Matrix 3.5lbs bilaterally 3x10 D2 shoulder flexion RUE 1lb weight 2x10 pt needs mod tactile and visual cues Prone  YTW over T ball no resistance 2x10 Chest press in Matrix 7.5lbs 3x10 Pt requires min verbal and tactile cues for proper exercise performance                               PT Education - 01/11/16 1045    Education provided Yes   Education Details discharge planning   Person(s) Educated Patient   Methods Explanation   Comprehension Verbalized understanding             PT Long Term Goals - 12/21/15 0847    PT LONG TERM GOAL #1   Title pt's quick DASH score will improve by at least 8 ponts indicating improved physical function and symptoms of L shoulder    Baseline 66.6% on 11/08    45%   Time 4   Period Weeks   Status Partially Met   PT LONG TERM GOAL #2   Title pt's left shoulder strength will be at least 4/5 in all planes to improve her ability to participate in pilates   Baseline less than 4/5 in all planes and limited by pain on 11/08   Time 4   Period Weeks   Status Achieved   PT LONG TERM GOAL #3  Title pt's fuctional flexion ROM will improve to at least 120 degrees to reach for dishes in top counter with less than 2/10   Baseline 90 degrees of AROM in sitting with 5/10 pain    Time 4   Period Weeks   Status Achieved   PT LONG TERM GOAL #4   Title pt will be able to place 3lb weight overhead x 5 with proper shoulder mechanics and pain <3/10   Time 4   Period Weeks   Status New   PT LONG TERM GOAL #5   Title pt will be able to put her seatbelt on with pain <3/10   Time 4   Period Weeks   Status New   Additional Long Term Goals   Additional Long Term Goals Yes   PT LONG TERM GOAL #6   Title pt will be able to be independent with progressed HEP for continued strengthening after DC   Time 4   Period Weeks   Status New               Plan - 01/11/16 1045    Clinical Impression Statement pt continues to do well with strengthening progression needing less cuing for correction. initiated deep neck flexor strengthening today to aid  with posture, as this is still a major impairment for her. She is unaware when she has poor posture. discussed DC planning for next week with progressed HEP. PT palpated R pectoralis which was tender after pt mentioned pain with horiz adduction of the shoulder.    Pt will benefit from skilled therapeutic intervention in order to improve on the following deficits Postural dysfunction;Decreased strength;Hypomobility;Impaired flexibility;Pain;Impaired UE functional use   Rehab Potential Fair   PT Frequency 2x / week   PT Duration 4 weeks   PT Treatment/Interventions ADLs/Self Care Home Management;Aquatic Therapy;Cryotherapy;Electrical Stimulation;Moist Heat;Iontophoresis 67m/ml Dexamethasone;Therapeutic exercise;Therapeutic activities;Neuromuscular re-education;Manual techniques;Dry needling;Passive range of motion   Consulted and Agree with Plan of Care Patient        Problem List Patient Active Problem List   Diagnosis Date Noted  . S/P primary low transverse C-section 04/29/2015  . Vaginal bleeding in pregnancy 04/28/2015  . Marginal placenta 04/28/2015  . Low lying placenta with hemorrhage in third trimester, antepartum 04/15/2015  . Sternal contusion 12/22/2014   AGorden Harms Cathy [place], PT, DPT #[id number] Cathy Owens 01/11/2016, 10:48 AM  CD[place]MAIN RFlorence Hospital At AnthemSERVICES [address] [address]Phone: [phone]  Fax:  [phone] Name: MMadge TherrienMRN: [id number] of Birth: 3Mar 17, 1977

## 2016-01-16 ENCOUNTER — Ambulatory Visit: Payer: BC Managed Care – PPO

## 2016-01-16 DIAGNOSIS — M25511 Pain in right shoulder: Secondary | ICD-10-CM | POA: Diagnosis not present

## 2016-01-16 DIAGNOSIS — R29898 Other symptoms and signs involving the musculoskeletal system: Secondary | ICD-10-CM

## 2016-01-16 DIAGNOSIS — M25611 Stiffness of right shoulder, not elsewhere classified: Secondary | ICD-10-CM

## 2016-01-16 NOTE — Therapy (Signed)
[place] MAIN [facility] SERVICES [address] [place], [place], [address] Phone: [phone]   Fax:  [phone]  Physical Therapy Treatment  Patient Details  Name: Cathy Owens MRN: [id number] Date of Birth: May 19, 1976 No Data Recorded  Encounter Date: 01/16/2016    Past Medical History  Diagnosis Date  . Kidney stones     Past Surgical History  Procedure Laterality Date  . Cholecystectomy  2009  . Index finger surgery      as a child  . Wisdom tooth extraction    . Lithotripsy  2013  . Gallbladder surgery    . Cesarean section N/A 04/29/2015    Procedure: CESAREAN SECTION;  Surgeon: Will Bonnet, MD;  Location: [facility] ORS;  Service: Obstetrics;  Laterality: N/A;    There were no vitals filed for this visit.  Visit Diagnosis:  Decreased ROM of right shoulder  Weakness of shoulder                                    PT Long Term Goals - 12/21/15 0847    PT LONG TERM GOAL #1   Title pt's quick DASH score will improve by at least 8 ponts indicating improved physical function and symptoms of L shoulder    Baseline 66.6% on 11/08    45%   Time 4   Period Weeks   Status Partially Met   PT LONG TERM GOAL #2   Title pt's left shoulder strength will be at least 4/5 in all planes to improve her ability to participate in pilates   Baseline less than 4/5 in all planes and limited by pain on 11/08   Time 4   Period Weeks   Status Achieved   PT LONG TERM GOAL #3   Title pt's fuctional flexion ROM will improve to at least 120 degrees to reach for dishes in top counter with less than 2/10   Baseline 90 degrees of AROM in sitting with 5/10 pain    Time 4   Period Weeks   Status Achieved   PT LONG TERM GOAL #4   Title pt will be able to place 3lb weight overhead x 5 with proper shoulder mechanics and pain <3/10   Time 4   Period Weeks   Status New   PT LONG TERM GOAL #5   Title pt will be able to put  her seatbelt on with pain <3/10   Time 4   Period Weeks   Status New   Additional Long Term Goals   Additional Long Term Goals Yes   PT LONG TERM GOAL #6   Title pt will be able to be independent with progressed HEP for continued strengthening after DC   Time 4   Period Weeks   Status New               Problem List Patient Active Problem List   Diagnosis Date Noted  . S/P primary low transverse C-section 04/29/2015  . Vaginal bleeding in pregnancy 04/28/2015  . Marginal placenta 04/28/2015  . Low lying placenta with hemorrhage in third trimester, antepartum 04/15/2015  . Sternal contusion 12/22/2014    Cathy Owens 01/16/2016, 6:42 PM  [place] MAIN [facility] SERVICES [address] [place], [place], [address] Phone: [phone]   Fax:  [phone]  Name: Cathy Owens MRN: [id number] Date of Birth: Jan 11, 1976

## 2016-01-18 ENCOUNTER — Ambulatory Visit: Payer: BC Managed Care – PPO | Attending: Student

## 2016-01-18 DIAGNOSIS — M25511 Pain in right shoulder: Secondary | ICD-10-CM | POA: Diagnosis not present

## 2016-01-18 DIAGNOSIS — R29898 Other symptoms and signs involving the musculoskeletal system: Secondary | ICD-10-CM | POA: Diagnosis present

## 2016-01-18 DIAGNOSIS — M25611 Stiffness of right shoulder, not elsewhere classified: Secondary | ICD-10-CM

## 2016-01-18 NOTE — Therapy (Signed)
[facility] MAIN [facility] SERVICES [address] [place], [place], [address] Phone: [phone]   Fax:  [phone]  Physical Therapy Treatment/Discharge note  Patient Details  Name: Cathy Owens MRN: [id number] Date of Birth: [birth year] No Data Recorded  Encounter Date: 01/18/2016      PT End of Session - 01/18/16 1757    Visit Number 6   Number of Visits 17   Date for PT Re-Evaluation 01/18/16   PT Start Time 1730   PT Stop Time 1815   PT Time Calculation (min) 45 min   Activity Tolerance Patient tolerated treatment well   Behavior During Therapy [facility] for tasks assessed/performed      Past Medical History  Diagnosis Date  . Kidney stones     Past Surgical History  Procedure Laterality Date  . Cholecystectomy  2009  . Index finger surgery      as a child  . Wisdom tooth extraction    . Lithotripsy  2013  . Gallbladder surgery    . Cesarean section N/A 04/29/2015    Procedure: CESAREAN SECTION;  Surgeon: Conard Novak, MD;  Location: [facility] ORS;  Service: Obstetrics;  Laterality: N/A;    There were no vitals filed for this visit.  Visit Diagnosis:  Decreased ROM of right shoulder  Weakness of shoulder      Subjective Assessment - 01/18/16 1747    Subjective pt reports she is a lot better, regarding her shoulder pain and function, but still has residual pain with movement - especially horizontal adduction and over heal flexion    Limitations Lifting   Patient Stated Goals improve function and decrease pain    Currently in Pain? Yes   Pain Score 2      Therex:   D2 shoulder flexion 1lb 2x10 D1 shoulder extension crossover red Tband 2x10 Prone YTW over ball 0lbs 2x10 each Wall slide with resisted lift off yellow band 2x10 Wall push up 2x10 sidelying shoulder flexion 0lbs 2x10 Supine shoulder flexion with resisted horiz abduction 2x10    Pt requires min verbal and tactile cues for proper exercise performance                               PT Long Term Goals - 01/18/16 1829    PT LONG TERM GOAL #1   Title pt's quick DASH score will improve by at least 8 ponts indicating improved physical function and symptoms of L shoulder    Baseline 66.6% on 11/08    45%, 01/18/16 25% disability   Time 4   Period Weeks   Status Achieved   PT LONG TERM GOAL #2   Title pt's left shoulder strength will be at least 4/5 in all planes to improve her ability to participate in pilates   Baseline less than 4/5 in all planes and limited by pain on 11/08   Time 4   Period Weeks   Status Achieved   PT LONG TERM GOAL #3   Title pt's fuctional flexion ROM will improve to at least 120 degrees to reach for dishes in top counter with less than 2/10   Baseline 90 degrees of AROM in sitting with 5/10 pain    Time 4   Period Weeks   Status Achieved   PT LONG TERM GOAL #4   Title pt will be able to place 3lb weight overhead x 5 with proper shoulder mechanics and  pain <3/10   Time 4   Period Weeks   Status Achieved   PT LONG TERM GOAL #5   Title pt will be able to put her seatbelt on with pain <3/10   Time 4   Period Weeks   Status Achieved   PT LONG TERM GOAL #6   Title pt will be able to be independent with progressed HEP for continued strengthening after DC   Time 4   Period Weeks   Status Achieved               Plan - 01/18/16 1831    Clinical Impression Statement pt has achieved PT goals at this time and will be DC to advanced strengthening program for HEP. she has made significant progress regarding her function, strength, ROM and pain. she still has residual pain at end ranges with overhead movement, however this is mild. She does still demonstrate poor posture needing cues, however this is likely premorbid.    Pt will benefit from skilled therapeutic intervention in order to improve on the following deficits Postural dysfunction;Decreased strength;Hypomobility;Impaired  flexibility;Pain;Impaired UE functional use   Rehab Potential Fair   PT Frequency 2x / week   PT Duration 4 weeks   PT Treatment/Interventions ADLs/Self Care Home Management;Aquatic Therapy;Cryotherapy;Electrical Stimulation;Moist Heat;Iontophoresis /ml Dexamethasone;Therapeutic exercise;Therapeutic activities;Neuromuscular re-education;Manual techniques;Dry needling;Passive range of motion   Consulted and Agree with Plan of Care Patient        Problem List Patient Active Problem List   Diagnosis Date Noted  . S/P primary low transverse C-section 04/29/2015  . Vaginal bleeding in pregnancy 04/28/2015  . Marginal placenta 04/28/2015  . Low lying placenta with hemorrhage in third trimester, antepartum 04/15/2015  . Sternal contusion 12/22/2014   [name], PT, DPT [phone]  [name] 01/18/2016, 6:34 PM  [facility] MAIN [facility] SERVICES [address] [place], [place], [address] Phone: [phone]   Fax:  [phone]  Name: Cathy Owens MRN: [id number] Date of Birth: 09-11-1976

## 2016-01-23 ENCOUNTER — Ambulatory Visit: Payer: BC Managed Care – PPO

## 2016-01-25 ENCOUNTER — Ambulatory Visit: Payer: BC Managed Care – PPO

## 2016-11-14 ENCOUNTER — Other Ambulatory Visit: Payer: Self-pay | Admitting: Nurse Practitioner

## 2016-11-14 DIAGNOSIS — N200 Calculus of kidney: Secondary | ICD-10-CM

## 2016-11-14 DIAGNOSIS — N211 Calculus in urethra: Secondary | ICD-10-CM

## 2016-12-02 ENCOUNTER — Ambulatory Visit
Admission: RE | Admit: 2016-12-02 | Discharge: 2016-12-02 | Disposition: A | Discharge status: Home / Self Care | Payer: BLUE CROSS/BLUE SHIELD | Source: Ambulatory Visit | Attending: Nurse Practitioner | Admitting: Nurse Practitioner

## 2016-12-02 DIAGNOSIS — N200 Calculus of kidney: Secondary | ICD-10-CM | POA: Diagnosis not present

## 2016-12-02 DIAGNOSIS — N211 Calculus in urethra: Secondary | ICD-10-CM

## 2016-12-20 ENCOUNTER — Other Ambulatory Visit: Payer: Self-pay | Admitting: Urology

## 2016-12-20 DIAGNOSIS — N200 Calculus of kidney: Secondary | ICD-10-CM

## 2017-02-01 ENCOUNTER — Telehealth (HOSPITAL_COMMUNITY): Payer: Self-pay

## 2017-02-01 NOTE — Telephone Encounter (Signed)
Mom called with questions about taking Flomax .4mg  daily as order by Dr. To pass kidney stone.  Mom is breastfeeding 9721 month old twice daily. "Medications & Mothers' Milk" 2014 lists Flomax as L3 with limited data and probably compatible.  Mom indicated ok to leave a message and LC did with above info.  Mom to call back as needed.

## 2017-06-13 ENCOUNTER — Other Ambulatory Visit: Payer: Self-pay | Admitting: Surgery

## 2017-06-13 DIAGNOSIS — M7581 Other shoulder lesions, right shoulder: Principal | ICD-10-CM

## 2017-06-13 DIAGNOSIS — M778 Other enthesopathies, not elsewhere classified: Secondary | ICD-10-CM

## 2017-07-14 ENCOUNTER — Ambulatory Visit
Admission: RE | Admit: 2017-07-14 | Discharge: 2017-07-14 | Disposition: A | Discharge status: Home / Self Care | Payer: BLUE CROSS/BLUE SHIELD | Source: Ambulatory Visit | Attending: Surgery | Admitting: Surgery

## 2017-07-14 DIAGNOSIS — M7581 Other shoulder lesions, right shoulder: Secondary | ICD-10-CM | POA: Diagnosis not present

## 2017-07-14 DIAGNOSIS — M7551 Bursitis of right shoulder: Secondary | ICD-10-CM | POA: Diagnosis not present

## 2017-07-14 DIAGNOSIS — M778 Other enthesopathies, not elsewhere classified: Secondary | ICD-10-CM

## 2017-07-14 DIAGNOSIS — M19011 Primary osteoarthritis, right shoulder: Secondary | ICD-10-CM | POA: Diagnosis not present

## 2017-07-14 MED ORDER — IOPAMIDOL (ISOVUE-200) INJECTION 41%
10.0000 mL | Freq: Once | INTRAVENOUS | Status: DC | PRN
  Filled 2017-07-14: qty 50

## 2017-07-14 MED ORDER — GADOBENATE DIMEGLUMINE 529 MG/ML IV SOLN: 0.1000 mL | Freq: Once | INTRAVENOUS | Status: DC | PRN

## 2017-07-14 MED ORDER — LIDOCAINE HCL (PF) 1 % IJ SOLN
10.0000 mL | Freq: Once | INTRAMUSCULAR | Status: DC
  Filled 2017-07-14: qty 10

## 2017-07-23 ENCOUNTER — Encounter: Payer: Self-pay | Admitting: *Deleted

## 2017-07-23 ENCOUNTER — Ambulatory Visit: Payer: BLUE CROSS/BLUE SHIELD | Admitting: Anesthesiology

## 2017-07-23 ENCOUNTER — Encounter
Admission: RE | Disposition: A | Discharge status: Home / Self Care | Payer: Self-pay | Source: Ambulatory Visit | Attending: Surgery

## 2017-07-23 ENCOUNTER — Ambulatory Visit
Admission: RE | Admit: 2017-07-23 | Discharge: 2017-07-23 | Disposition: A | Discharge status: Home / Self Care | Payer: BLUE CROSS/BLUE SHIELD | Source: Ambulatory Visit | Attending: Surgery | Admitting: Surgery

## 2017-07-23 DIAGNOSIS — Z818 Family history of other mental and behavioral disorders: Secondary | ICD-10-CM | POA: Insufficient documentation

## 2017-07-23 DIAGNOSIS — Z882 Allergy status to sulfonamides status: Secondary | ICD-10-CM | POA: Insufficient documentation

## 2017-07-23 DIAGNOSIS — Z87440 Personal history of urinary (tract) infections: Secondary | ICD-10-CM | POA: Diagnosis not present

## 2017-07-23 DIAGNOSIS — Z87442 Personal history of urinary calculi: Secondary | ICD-10-CM | POA: Diagnosis not present

## 2017-07-23 DIAGNOSIS — Z8249 Family history of ischemic heart disease and other diseases of the circulatory system: Secondary | ICD-10-CM | POA: Diagnosis not present

## 2017-07-23 DIAGNOSIS — Z9049 Acquired absence of other specified parts of digestive tract: Secondary | ICD-10-CM | POA: Insufficient documentation

## 2017-07-23 DIAGNOSIS — Z880 Allergy status to penicillin: Secondary | ICD-10-CM | POA: Insufficient documentation

## 2017-07-23 DIAGNOSIS — M7541 Impingement syndrome of right shoulder: Secondary | ICD-10-CM | POA: Insufficient documentation

## 2017-07-23 DIAGNOSIS — Z841 Family history of disorders of kidney and ureter: Secondary | ICD-10-CM | POA: Diagnosis not present

## 2017-07-23 DIAGNOSIS — M7521 Bicipital tendinitis, right shoulder: Secondary | ICD-10-CM | POA: Insufficient documentation

## 2017-07-23 DIAGNOSIS — M65811 Other synovitis and tenosynovitis, right shoulder: Secondary | ICD-10-CM | POA: Insufficient documentation

## 2017-07-23 DIAGNOSIS — Z9889 Other specified postprocedural states: Secondary | ICD-10-CM | POA: Insufficient documentation

## 2017-07-23 HISTORY — PX: SHOULDER ARTHROSCOPY: SHX128

## 2017-07-23 HISTORY — DX: Other specified postprocedural states: R11.2

## 2017-07-23 HISTORY — DX: Headache, unspecified: R51.9

## 2017-07-23 HISTORY — DX: Other specified postprocedural states: Z98.890

## 2017-07-23 HISTORY — DX: Motion sickness, initial encounter: T75.3XXA

## 2017-07-23 HISTORY — DX: Headache: R51

## 2017-07-23 SURGERY — ARTHROSCOPY, SHOULDER
Anesthesia: General | Site: Shoulder | Laterality: Right | Wound class: Clean

## 2017-07-23 MED ORDER — OXYCODONE HCL 5 MG PO TABS: 5.0000 mg | ORAL_TABLET | ORAL | Status: DC | PRN

## 2017-07-23 MED ORDER — FENTANYL CITRATE (PF) 100 MCG/2ML IJ SOLN
INTRAMUSCULAR | Status: DC | PRN
  Administered 2017-07-23: 50 ug via INTRAVENOUS

## 2017-07-23 MED ORDER — LACTATED RINGERS IV SOLN
INTRAVENOUS | Status: DC
  Administered 2017-07-23: 11:00:00 via INTRAVENOUS

## 2017-07-23 MED ORDER — LIDOCAINE HCL (CARDIAC) 20 MG/ML IV SOLN
INTRAVENOUS | Status: DC | PRN
  Administered 2017-07-23: 50 mg via INTRATRACHEAL

## 2017-07-23 MED ORDER — OXYCODONE HCL 5 MG/5ML PO SOLN: 5.0000 mg | Freq: Once | ORAL | Status: DC | PRN

## 2017-07-23 MED ORDER — MIDAZOLAM HCL 2 MG/2ML IJ SOLN
INTRAMUSCULAR | Status: DC | PRN
  Administered 2017-07-23: 2 mg via INTRAVENOUS

## 2017-07-23 MED ORDER — ONDANSETRON HCL 4 MG PO TABS: 4.0000 mg | ORAL_TABLET | Freq: Four times a day (QID) | ORAL | Status: DC | PRN

## 2017-07-23 MED ORDER — HYDROMORPHONE HCL 1 MG/ML IJ SOLN: 0.2500 mg | INTRAMUSCULAR | Status: DC | PRN

## 2017-07-23 MED ORDER — DEXAMETHASONE SODIUM PHOSPHATE 4 MG/ML IJ SOLN
INTRAMUSCULAR | Status: DC | PRN
  Administered 2017-07-23: 8 mg via INTRAVENOUS
  Administered 2017-07-23: 4 mg via INTRAVENOUS

## 2017-07-23 MED ORDER — ACETAMINOPHEN 160 MG/5ML PO SOLN: 325.0000 mg | ORAL | Status: DC | PRN

## 2017-07-23 MED ORDER — ONDANSETRON HCL 4 MG/2ML IJ SOLN: 4.0000 mg | Freq: Once | INTRAMUSCULAR | Status: DC | PRN

## 2017-07-23 MED ORDER — ONDANSETRON HCL 4 MG/2ML IJ SOLN
INTRAMUSCULAR | Status: DC | PRN
  Administered 2017-07-23: 8 mg via INTRAVENOUS

## 2017-07-23 MED ORDER — METOCLOPRAMIDE HCL 5 MG PO TABS: 5.0000 mg | ORAL_TABLET | Freq: Three times a day (TID) | ORAL | Status: DC | PRN

## 2017-07-23 MED ORDER — ONDANSETRON HCL 4 MG/2ML IJ SOLN: 4.0000 mg | Freq: Four times a day (QID) | INTRAMUSCULAR | Status: DC | PRN

## 2017-07-23 MED ORDER — METOCLOPRAMIDE HCL 5 MG/ML IJ SOLN: 5.0000 mg | Freq: Three times a day (TID) | INTRAMUSCULAR | Status: DC | PRN

## 2017-07-23 MED ORDER — SCOPOLAMINE 1 MG/3DAYS TD PT72
1.0000 | MEDICATED_PATCH | TRANSDERMAL | Status: DC
Start: 2017-07-23 — End: 2017-07-24
  Administered 2017-07-23: 1.5 mg via TRANSDERMAL

## 2017-07-23 MED ORDER — CLINDAMYCIN PHOSPHATE 900 MG/50ML IV SOLN
900.0000 mg | Freq: Once | INTRAVENOUS | Status: AC
  Administered 2017-07-23: 900 mg via INTRAVENOUS

## 2017-07-23 MED ORDER — ROPIVACAINE HCL 5 MG/ML IJ SOLN
INTRAMUSCULAR | Status: DC | PRN
  Administered 2017-07-23: 30 mL via PERINEURAL

## 2017-07-23 MED ORDER — PROPOFOL 10 MG/ML IV BOLUS
INTRAVENOUS | Status: DC | PRN
  Administered 2017-07-23: 200 mg via INTRAVENOUS

## 2017-07-23 MED ORDER — ACETAMINOPHEN 325 MG PO TABS: 325.0000 mg | ORAL_TABLET | ORAL | Status: DC | PRN

## 2017-07-23 MED ORDER — OXYCODONE HCL 5 MG PO TABS: 5.0000 mg | ORAL_TABLET | Freq: Once | ORAL | Status: DC | PRN

## 2017-07-23 MED ORDER — LIDOCAINE-EPINEPHRINE (PF) 1 %-1:200000 IJ SOLN
INTRAMUSCULAR | Status: DC | PRN
  Administered 2017-07-23: 30 mL

## 2017-07-23 SURGICAL SUPPLY — 37 items
ANCHOR JUGGERKNOT WTAP NDL 2.9 (Anchor) ×3 IMPLANT
BIT DRILL JUGRKNT W/NDL BIT2.9 (DRILL) ×1 IMPLANT
BLADE FULL RADIUS 3.5 (BLADE) ×3 IMPLANT
BUR ACROMIONIZER 4.0 (BURR) ×3 IMPLANT
CANNULA SHAVER 8MMX76MM (CANNULA) ×3 IMPLANT
CHLORAPREP W/TINT 26ML (MISCELLANEOUS) ×6 IMPLANT
COVER LIGHT HANDLE UNIVERSAL (MISCELLANEOUS) ×6 IMPLANT
COVER MAYO STAND STRL (DRAPES) ×3 IMPLANT
DRAPE IMP U-DRAPE 54X76 (DRAPES) ×6 IMPLANT
DRILL JUGGERKNOT W/NDL BIT 2.9 (DRILL) ×3
GAUZE PETRO XEROFOAM 1X8 (MISCELLANEOUS) ×3 IMPLANT
GAUZE SPONGE 4X4 12PLY STRL (GAUZE/BANDAGES/DRESSINGS) ×3 IMPLANT
GLOVE BIO SURGEON STRL SZ8 (GLOVE) ×6 IMPLANT
GLOVE INDICATOR 8.0 STRL GRN (GLOVE) ×3 IMPLANT
GOWN STRL REUS W/ TWL LRG LVL3 (GOWN DISPOSABLE) ×1 IMPLANT
GOWN STRL REUS W/ TWL XL LVL3 (GOWN DISPOSABLE) ×1 IMPLANT
GOWN STRL REUS W/TWL LRG LVL3 (GOWN DISPOSABLE) ×2
GOWN STRL REUS W/TWL XL LVL3 (GOWN DISPOSABLE) ×2
IV LACTATED RINGER IRRG 3000ML (IV SOLUTION) ×4
IV LR IRRIG 3000ML ARTHROMATIC (IV SOLUTION) ×2 IMPLANT
MANIFOLD 4PT FOR NEPTUNE1 (MISCELLANEOUS) ×3 IMPLANT
MAT BLUE FLOOR 46X72 FLO (MISCELLANEOUS) ×3 IMPLANT
NEEDLE HYPO 21X1.5 SAFETY (NEEDLE) ×3 IMPLANT
NEEDLE REVERSE CUT 1/2 CRC (NEEDLE) ×3 IMPLANT
PACK ARTHROSCOPY SHOULDER (MISCELLANEOUS) ×3 IMPLANT
PAD GROUND ADULT SPLIT (MISCELLANEOUS) ×3 IMPLANT
SLING ULTRA II M (MISCELLANEOUS) ×3 IMPLANT
STAPLER SKIN PROX 35W (STAPLE) ×3 IMPLANT
STRAP BODY AND KNEE 60X3 (MISCELLANEOUS) ×6 IMPLANT
SUT ETHIBOND 0 MO6 C/R (SUTURE) ×3 IMPLANT
SUT VIC AB 2-0 CT1 27 (SUTURE) ×2
SUT VIC AB 2-0 CT1 TAPERPNT 27 (SUTURE) ×1 IMPLANT
TAPE MICROFOAM 4IN (TAPE) ×3 IMPLANT
TUBING ARTHRO INFLOW-ONLY STRL (TUBING) ×3 IMPLANT
TUBING CONNECTING 10 (TUBING) ×2 IMPLANT
TUBING CONNECTING 10' (TUBING) ×1
WAND HAND CNTRL MULTIVAC 90 (MISCELLANEOUS) ×3 IMPLANT

## 2017-07-23 NOTE — H&P (Signed)
Paper H&P to be scanned into permanent record. H&P reviewed and patient re-examined. No changes. 

## 2017-07-23 NOTE — Op Note (Signed)
07/23/2017  2:15 PM  Patient:   Cathy Owens  Pre-Op Diagnosis:   Residual/tendinopathy with biceps tendinopathy and possible SLAP tear, right shoulder.  Post-Op Diagnosis: Impingement/tendinopathy with biceps tendinopathy, right shoulder.  Procedure: Limited arthroscopic debridement, arthroscopic subacromial decompression, and mini-open biceps tenodesis, right shoulder.  Anesthesia: General LMA with interscalene block placed preoperatively by the anesthesiologist.  Surgeon:   Maryagnes Amos, MD  Assistant:   None  Findings: As above. The rotator cuff and labrum both were in excellent condition. There was moderate inflammation along the biceps tendon. There also appeared to be some partial tearing of the middle glenohumeral ligament. The articular surfaces of both the glenoid and humerus were in excellent condition.  Complications: None  Fluids:   400 cc  Estimated blood loss: 5 cc  Tourniquet time: None  Drains: None  Closure: Staples   Brief clinical note: The patient is a 41 year old female with a long history of anterior right shoulder pain. The patient's symptoms have progressed despite medications, activity modification, etc. The patient's history and examination are consistent with impingement/tendinopathy with biceps tendinitis and a possible SLAP tear. These findings also were suggested by MRI scan. The patient presents at this time for definitive management of her shoulder symptoms.  Procedure: The patient underwent placement of an interscalene block by the anesthesiologist in the preoperative holding area before being brought into the operating room and lain in the supine position. The patient then underwent general laryngeal mask anesthesia before being repositioned in the beach chair position using the beach chair positioner. The right shoulder and upper extremity were prepped with ChloraPrep solution before being draped sterilely. Preoperative  antibiotics were administered. A timeout was performed to confirm the proper surgical site before the expected portal sites and incision site were injected with 1.0% lidocaine with epinephrine. A posterior portal was created and the glenohumeral joint thoroughly inspected with the findings as described above. An anterior portal was created using an outside-in technique. The labrum and rotator cuff were further probed, again confirming the above-noted findings. There was no evidence for any SLAP tear. The area of partial-thickness tearing of the middle glenohumeral ligament was debrided back to stable margins using the full-radius resector. Several small areas of synovitis also were debrided using the full-radius resector. The ArthroCare wand was inserted and used to obtain hemostasis. The instruments were removed from the joint after suctioning the excess fluid.  The camera was repositioned through the posterior portal into the subacromial space. A separate lateral portal was created using an outside-in technique. The 3.5 mm full-radius resector was introduced and used to perform a subtotal bursectomy. The ArthroCare wand was then inserted and used to remove the periosteal tissue off the undersurface of the anterior third of the acromion as well as to recess the coracoacromial ligament from its attachment along the anterior and lateral margins of the acromion. The full radius resector was used to complete the decompression by removing the undersurface of the anterior third of the acromion. The full radius resector was reintroduced to remove any residual bony debris before the ArthroCare wand was reintroduced to obtain hemostasis. The instruments were then removed from the subacromial space after suctioning the excess fluid.  An approximately 2.5-3 cm incision was made over the anterolateral aspect of the shoulder beginning just distal to the anterolateral corner of the acromion and extending distally in line  with the bicipital groove. This incision was carried down through the subcutaneous tissues to expose the deltoid fascia. The raphae between  the anterior and middle thirds was identified and this plane developed to provide access into the subacromial space. Additional bursal tissues were debrided sharply using Metzenbaum scissors. The rotator cuff was carefully inspected. There is no evidence for any partial full-thickness bursal surface tears.   The bicipital groove was identified by palpation and opened for 1-1.5 cm. The biceps tendon stump was retrieved through this defect. The floor of the bicipital groove was roughened with a rongeur before single Biomet 2.9 mm JuggerKnot anchor was inserted. Both sets of sutures were passed through the biceps tendon and tied securely to effect the tenodesis. The bicipital sheath was reapproximated using two #0 Ethibond interrupted sutures, incorporating the biceps tendon to further reinforce the tenodesis.  The wound was copiously irrigated with sterile saline solution before the deltoid raphae was reapproximated using 2-0 Vicryl interrupted sutures. The subcutaneous tissues were closed in two layers using 2-0 Vicryl interrupted sutures before the skin was closed using staples. The portal sites also were closed using staples. A sterile bulky dressing was applied to the shoulder before the arm was placed into a shoulder immobilizer. The patient was then awakened, extubated, and returned to the recovery room in satisfactory condition after tolerating the procedure well.

## 2017-07-23 NOTE — Anesthesia Procedure Notes (Signed)
Anesthesia Regional Block: Interscalene brachial plexus block   Pre-Anesthetic Checklist: ,, timeout performed, Correct Patient, Correct Site, Correct Laterality, Correct Procedure, Correct Position, site marked, Risks and benefits discussed,  Surgical consent,  Pre-op evaluation,  At surgeon's request and post-op pain management   Prep: chloraprep       Needles:  Injection technique: Single-shot  Needle Type: Stimiplex     Needle Length: 5cm  Needle Gauge: 22     Additional Needles:   Procedures: ultrasound guided,,,,,,,,  Narrative:  Start time: 07/23/2017 11:59 AM End time: 07/23/2017 12:07 PM Injection made incrementally with aspirations every 5 mL.  Performed by: Personally   Additional Notes: Functioning IV was confirmed and monitors applied. Ultrasound guidance: relevant anatomy identified, needle position confirmed, local anesthetic spread visualized around nerve(s)., vascular puncture avoided.  Image printed for medical record.  Negative aspiration and no paresthesias; incremental administration of local anesthetic. The patient tolerated the procedure well. Vitals signes recorded in RN notes.

## 2017-07-23 NOTE — Transfer of Care (Signed)
Immediate Anesthesia Transfer of Care Note  Patient: Cathy Owens  Procedure(s) Performed: Procedure(s): ARTHROSCOPY SHOULDER with debridement decompression and  biceps tenodesis (Right)  Patient Location: PACU  Anesthesia Type: General  Level of Consciousness: awake, alert  and patient cooperative  Airway and Oxygen Therapy: Patient Spontanous Breathing and Patient connected to supplemental oxygen  Post-op Assessment: Post-op Vital signs reviewed, Patient's Cardiovascular Status Stable, Respiratory Function Stable, Patent Airway and No signs of Nausea or vomiting  Post-op Vital Signs: Reviewed and stable  Complications: No apparent anesthesia complications

## 2017-07-23 NOTE — Discharge Instructions (Signed)
General Anesthesia, Adult, Care After These instructions provide you with information about caring for yourself after your procedure. Your health care provider may also give you more specific instructions. Your treatment has been planned according to current medical practices, but problems sometimes occur. Call your health care provider if you have any problems or questions after your procedure. What can I expect after the procedure? After the procedure, it is common to have:  Vomiting.  A sore throat.  Mental slowness.  It is common to feel:  Nauseous.  Cold or shivery.  Sleepy.  Tired.  Sore or achy, even in parts of your body where you did not have surgery.  Follow these instructions at home: For at least 24 hours after the procedure:  Do not: ? Participate in activities where you could fall or become injured. ? Drive. ? Use heavy machinery. ? Drink alcohol. ? Take sleeping pills or medicines that cause drowsiness. ? Make important decisions or sign legal documents. ? Take care of children on your own.  Rest. Eating and drinking  If you vomit, drink water, juice, or soup when you can drink without vomiting.  Drink enough fluid to keep your urine clear or pale yellow.  Make sure you have little or no nausea before eating solid foods.  Follow the diet recommended by your health care provider. General instructions  Have a responsible adult stay with you until you are awake and alert.  Return to your normal activities as told by your health care provider. Ask your health care provider what activities are safe for you.  Take over-the-counter and prescription medicines only as told by your health care provider.  If you smoke, do not smoke without supervision.  Keep all follow-up visits as told by your health care provider. This is important. Contact a health care provider if:  You continue to have nausea or vomiting at home, and medicines are not helpful.  You  cannot drink fluids or start eating again.  You cannot urinate after 8-12 hours.  You develop a skin rash.  You have fever.  You have increasing redness at the site of your procedure. Get help right away if:  You have difficulty breathing.  You have chest pain.  You have unexpected bleeding.  You feel that you are having a life-threatening or urgent problem. This information is not intended to replace advice given to you by your health care provider. Make sure you discuss any questions you have with your health care provider. Document Released: 03/10/2001 Document Revised: 05/06/2016 Document Reviewed: 11/16/2015 Elsevier Interactive Patient Education  2018 [employer]  Keep dressing dry and intact.  May shower after dressing changed on post-op day #4 (Sunday).  Cover staples with Band-Aids after drying off. Apply ice frequently to shoulder. Take ibuprofen 600-800 mg TID with meals for 7-10 days, then as necessary. Take pain medication as prescribed or ES Tylenol when needed.  Keep shoulder immobilizer on at all times except may remove for bathing purposes. Follow-up in 10-14 days or as scheduled.

## 2017-07-23 NOTE — Anesthesia Procedure Notes (Signed)
Procedure Name: LMA Insertion Date/Time: 07/23/2017 12:55 PM Performed by: Andee PolesBUSH, [employer] Pre-anesthesia Checklist: Patient identified, Emergency Drugs available, Suction available, Timeout performed and Patient being monitored Patient Re-evaluated:Patient Re-evaluated prior to induction Oxygen Delivery Method: Circle system utilized Preoxygenation: Pre-oxygenation with 100% oxygen Induction Type: IV induction LMA: LMA inserted LMA Size: 4.0 Number of attempts: 1 Placement Confirmation: positive ETCO2 and breath sounds checked- equal and bilateral Tube secured with: Tape

## 2017-07-23 NOTE — Anesthesia Preprocedure Evaluation (Addendum)
Anesthesia Evaluation  Patient identified by MRN, date of birth, ID band Patient awake    Reviewed: Allergy & Precautions, H&P , NPO status , Patient's Chart, lab work & pertinent test results, reviewed documented beta blocker date and time   History of Anesthesia Complications (+) PONV and history of anesthetic complications  Airway Mallampati: I  TM Distance: >3 FB Neck ROM: full    Dental no notable dental hx.    Pulmonary neg pulmonary ROS,    Pulmonary exam normal breath sounds clear to auscultation       Cardiovascular Exercise Tolerance: Good negative cardio ROS Normal cardiovascular exam Rhythm:regular Rate:Normal     Neuro/Psych negative neurological ROS  negative psych ROS   GI/Hepatic negative GI ROS, Neg liver ROS,   Endo/Other  negative endocrine ROS  Renal/GU negative Renal ROS  negative genitourinary   Musculoskeletal   Abdominal   Peds  Hematology negative hematology ROS (+)   Anesthesia Other Findings   Reproductive/Obstetrics negative OB ROS                             Anesthesia Physical Anesthesia Plan  ASA: I  Anesthesia Plan: General   Post-op Pain Management: GA combined w/ Regional for post-op pain   Induction:   PONV Risk Score and Plan: Ondansetron, Dexamethasone and Scopolamine patch - Pre-op  Airway Management Planned:   Additional Equipment:   Intra-op Plan:   Post-operative Plan:   Informed Consent: I have reviewed the patients History and Physical, chart, labs and discussed the procedure including the risks, benefits and alternatives for the proposed anesthesia with the patient or authorized representative who has indicated his/her understanding and acceptance.   Dental Advisory Given  Plan Discussed with: CRNA and Anesthesiologist  Anesthesia Plan Comments:        Anesthesia Quick Evaluation

## 2017-07-25 ENCOUNTER — Encounter: Payer: Self-pay | Admitting: Surgery

## 2017-08-08 NOTE — Anesthesia Postprocedure Evaluation (Signed)
Anesthesia Post Note  Patient: Cathy Owens  Procedure(s) Performed: Procedure(s) (LRB): ARTHROSCOPY SHOULDER with debridement decompression and  biceps tenodesis (Right)  Patient location during evaluation: PACU Anesthesia Type: General Level of consciousness: awake and alert Pain management: pain level controlled Vital Signs Assessment: post-procedure vital signs reviewed and stable Respiratory status: spontaneous breathing, nonlabored ventilation, respiratory function stable and patient connected to nasal cannula oxygen Cardiovascular status: blood pressure returned to baseline and stable Postop Assessment: no signs of nausea or vomiting Anesthetic complications: no    Alta Corning

## 2018-11-19 ENCOUNTER — Encounter: Payer: Self-pay | Admitting: Advanced Practice Midwife

## 2018-11-19 ENCOUNTER — Ambulatory Visit (INDEPENDENT_AMBULATORY_CARE_PROVIDER_SITE_OTHER): Payer: BLUE CROSS/BLUE SHIELD | Admitting: Advanced Practice Midwife

## 2018-11-19 ENCOUNTER — Other Ambulatory Visit (HOSPITAL_COMMUNITY)
Admission: RE | Admit: 2018-11-19 | Discharge: 2018-11-19 | Disposition: A | Discharge status: Home / Self Care | Payer: BLUE CROSS/BLUE SHIELD | Source: Ambulatory Visit | Attending: Advanced Practice Midwife | Admitting: Advanced Practice Midwife

## 2018-11-19 VITALS — BP 118/74 | Ht 68.5 in | Wt 149.0 lb

## 2018-11-19 DIAGNOSIS — Z124 Encounter for screening for malignant neoplasm of cervix: Secondary | ICD-10-CM | POA: Diagnosis present

## 2018-11-19 DIAGNOSIS — Z1239 Encounter for other screening for malignant neoplasm of breast: Secondary | ICD-10-CM

## 2018-11-19 DIAGNOSIS — Z01419 Encounter for gynecological examination (general) (routine) without abnormal findings: Secondary | ICD-10-CM | POA: Diagnosis present

## 2018-11-19 DIAGNOSIS — Z0000 Encounter for general adult medical examination without abnormal findings: Secondary | ICD-10-CM

## 2018-11-19 NOTE — Progress Notes (Signed)
Patient ID: Cathy Owens, female   DOB: 10/30/1976, 42 y.o.   MRN: [id number]    Gynecology Annual Exam  PCP: Jerl MinaHedrick, James, MD  Chief Complaint:  Chief Complaint  Patient presents with  . Annual Exam    History of Present Illness: Patient is a 42 y.o. [id number] presents for annual exam. The patient has no gyn complaints today. She was last seen about 3 years ago following the birth of her youngest child. She requests referral for screening mammogram since she is now 42.   LMP: Patient's last menstrual period was 11/09/2018. Average Interval: regular, 30 days Duration of flow: 6 days Heavy Menses: varies Clots: no Intermenstrual Bleeding: no Postcoital Bleeding: no Dysmenorrhea: no   The patient is sexually active. She currently uses condoms for contraception. She denies dyspareunia.  The patient does perform self breast exams.  There is no notable family history of breast or ovarian cancer in her family.  The patient wears seatbelts: yes.   The patient has regular exercise: She is active with her 5 children. She admits healthy diet. She admits needing to increase amount of water she drinks especially due to her history of kidney stones.    The patient denies current symptoms of depression.    Review of Systems: Review of Systems  Constitutional: Negative.   HENT: Negative.   Eyes: Negative.   Respiratory: Negative.   Cardiovascular: Negative.   Gastrointestinal: Positive for diarrhea and nausea.  Genitourinary: Negative.   Musculoskeletal: Negative.   Skin: Negative.   Neurological: Negative.   Endo/Heme/Allergies: Negative.   Psychiatric/Behavioral: Negative.     Past Medical History:  Past Medical History:  Diagnosis Date  . Car sickness   . Headache    Migraines  . Kidney stones   . PONV (postoperative nausea and vomiting)    phenergen worked well with last surgery    Past Surgical History:  Past Surgical History:  Procedure Laterality Date  . CESAREAN  SECTION N/A 04/29/2015   Procedure: CESAREAN SECTION;  Surgeon: Conard NovakStephen D Jackson, MD;  Location: [facility] ORS;  Service: Obstetrics;  Laterality: N/A;  . CHOLECYSTECTOMY  2009  . GALLBLADDER SURGERY    . index finger surgery     as a child  . LITHOTRIPSY  2013  . SHOULDER ARTHROSCOPY Right 07/23/2017   Procedure: ARTHROSCOPY SHOULDER with debridement decompression and  biceps tenodesis;  Surgeon: Christena FlakePoggi, John J, MD;  [facility];  Service: Orthopedics;  Laterality: Right;  . uroscopy    . WISDOM TOOTH EXTRACTION      Gynecologic History:  Patient's last menstrual period was 11/09/2018. Contraception: condoms Last Pap: 3 years ago Results were:  no abnormalities  Last mammogram: has never had a mammogram  Obstetric History: [id number]  Family History:  History reviewed. No pertinent family history.  Social History:  Social History   Socioeconomic History  . Marital status: Married    Spouse name: Not on file  . Number of children: Not on file  . Years of education: Not on file  . Highest education level: Not on file  Occupational History  . Not on file  Social Needs  . Financial resource strain: Not on file  . Food insecurity:    Worry: Not on file    Inability: Not on file  . Transportation needs:    Medical: Not on file    Non-medical: Not on file  Tobacco Use  . Smoking status: Never Smoker  . Smokeless tobacco: Never Used  Substance and Sexual Activity  . Alcohol use: No    Alcohol/week: 0.0 standard drinks  . Drug use: No  . Sexual activity: Yes    Birth control/protection: None  Lifestyle  . Physical activity:    Days per week: Not on file    Minutes per session: Not on file  . Stress: Not on file  Relationships  . Social connections:    Talks on phone: Not on file    Gets together: Not on file    Attends religious service: Not on file    Active member of club or organization: Not on file    Attends meetings of clubs or organizations: Not on  file    Relationship status: Not on file  . Intimate partner violence:    Fear of current or ex partner: Not on file    Emotionally abused: Not on file    Physically abused: Not on file    Forced sexual activity: Not on file  Other Topics Concern  . Not on file  Social History Narrative  . Not on file    Allergies:  Allergies  Allergen Reactions  . Bactrim [Sulfamethoxazole-Trimethoprim] Hives  . Penicillins Hives  . Sulfa Antibiotics Hives    Medications: Prior to Admission medications   Medication Sig Start Date End Date Taking? Authorizing Provider  albuterol (PROAIR HFA) 108 (90 Base) MCG/ACT inhaler INHALE 2 PUFFS BY MOUTH EVERY 6 HOURS AS NEEDED FOR WHEEZE 02/23/18  Yes [provider]  fluticasone (FLONASE) 50 MCG/ACT nasal spray Place into the nose. 01/23/18 01/23/19 Yes [provider]  acetaminophen (TYLENOL) 500 MG tablet Take 500-1,000 mg by mouth every 6 (six) hours as needed for mild pain or headache.    [provider]  Prenatal Vit-Fe Fumarate-FA (PRENATAL MULTIVITAMIN) TABS tablet Take 1 tablet by mouth daily.    [provider]    Physical Exam Vitals: Blood pressure 118/74, height 5' 8.5" (1.74 m), weight 149 lb (67.6 kg), last menstrual period 11/09/2018  General: NAD HEENT: normocephalic, anicteric Thyroid: no enlargement, no palpable nodules Pulmonary: No increased work of breathing, CTAB Cardiovascular: RRR, distal pulses 2+ Breast: Breast symmetrical, no tenderness, no palpable nodules or masses, no skin or nipple retraction present, no nipple discharge.  No axillary or supraclavicular lymphadenopathy. Abdomen: NABS, soft, non-tender, non-distended.  Umbilicus without lesions.  No hepatomegaly, splenomegaly or masses palpable. No evidence of hernia  Genitourinary:  External: Normal external female genitalia.  Normal urethral meatus, normal Bartholin's and Skene's glands.    Vagina: Normal vaginal mucosa, no evidence of  prolapse.    Cervix: Grossly normal in appearance, friable, no CMT  Uterus: Non-enlarged, mobile, normal contour.    Adnexa: ovaries non-enlarged, no adnexal masses  Rectal: deferred  Lymphatic: no evidence of inguinal lymphadenopathy Extremities: no edema, erythema, or tenderness Neurologic: Grossly intact Psychiatric: mood appropriate, affect full   Assessment: 42 y.o. [id number] routine annual exam  Plan: Problem List Items Addressed This Visit    None    Visit Diagnoses    Well woman exam with routine gynecological exam    -  Primary   Relevant Orders   Cytology - PAP   Blood tests for routine general physical examination       Relevant Orders   CBC with Differential/Platelet   Lipid Panel With LDL/HDL Ratio   VITAMIN D 25 Hydroxy (Vit-D Deficiency, Fractures)   Cervical cancer screening       Relevant Orders   Cytology - PAP  Breast cancer screening       Relevant Orders   MM DIGITAL SCREENING BILATERAL      1) Mammogram - recommend yearly screening mammogram.  Mammogram Was ordered today   2) STI screening  was offered and declined  3) ASCCP guidelines and rational discussed.  Patient opts for every 3 years screening interval  4) Contraception - the patient is currently using  condoms.  She is happy with her current form of contraception and plans to continue  5) Colonoscopy -- Screening recommended starting at age 66 for average risk individuals, age 62 for individuals deemed at increased risk (including African Americans) and recommended to continue until age 29.  For patient age 22-85 individualized approach is recommended.  Gold standard screening is via colonoscopy, Cologuard screening is an acceptable alternative for patient unwilling or unable to undergo colonoscopy.  "Colorectal cancer screening for average?risk adults: 2018 guideline update from the American Cancer Society"CA: A Cancer Journal for Clinicians: May 14, 2017   6) Routine healthcare maintenance  including cholesterol, diabetes screening discussed Ordered today  7) Return in 1 year (on 11/20/2019).   Tresea Mall, CNM [facility], [facility] 11/19/2018, 4:38 PM

## 2018-11-19 NOTE — Patient Instructions (Addendum)
Preventive Care 40-64 Years, Female Preventive care refers to lifestyle choices and visits with your health care provider that can promote health and wellness. What does preventive care include?  A yearly physical exam. This is also called an annual well check.  Dental exams once or twice a year.  Routine eye exams. Ask your health care provider how often you should have your eyes checked.  Personal lifestyle choices, including: ? Daily care of your teeth and gums. ? Regular physical activity. ? Eating a healthy diet. ? Avoiding tobacco and drug use. ? Limiting alcohol use. ? Practicing safe sex. ? Taking low-dose aspirin daily starting at age 58. ? Taking vitamin and mineral supplements as recommended by your health care provider. What happens during an annual well check? The services and screenings done by your health care provider during your annual well check will depend on your age, overall health, lifestyle risk factors, and family history of disease. Counseling Your health care provider may ask you questions about your:  Alcohol use.  Tobacco use.  Drug use.  Emotional well-being.  Home and relationship well-being.  Sexual activity.  Eating habits.  Work and work Statistician.  Method of birth control.  Menstrual cycle.  Pregnancy history.  Screening You may have the following tests or measurements:  Height, weight, and BMI.  Blood pressure.  Lipid and cholesterol levels. These may be checked every 5 years, or more frequently if you are over 81 years old.  Skin check.  Lung cancer screening. You may have this screening every year starting at age 78 if you have a 30-pack-year history of smoking and currently smoke or have quit within the past 15 years.  Fecal occult blood test (FOBT) of the stool. You may have this test every year starting at age 65.  Flexible sigmoidoscopy or colonoscopy. You may have a sigmoidoscopy every 5 years or a colonoscopy  every 10 years starting at age 30.  Hepatitis C blood test.  Hepatitis B blood test.  Sexually transmitted disease (STD) testing.  Diabetes screening. This is done by checking your blood sugar (glucose) after you have not eaten for a while (fasting). You may have this done every 1-3 years.  Mammogram. This may be done every 1-2 years. Talk to your health care provider about when you should start having regular mammograms. This may depend on whether you have a family history of breast cancer.  BRCA-related cancer screening. This may be done if you have a family history of breast, ovarian, tubal, or peritoneal cancers.  Pelvic exam and Pap test. This may be done every 3 years starting at age 80. Starting at age 36, this may be done every 5 years if you have a Pap test in combination with an HPV test.  Bone density scan. This is done to screen for osteoporosis. You may have this scan if you are at high risk for osteoporosis.  Discuss your test results, treatment options, and if necessary, the need for more tests with your health care provider. Vaccines Your health care provider may recommend certain vaccines, such as:  Influenza vaccine. This is recommended every year.  Tetanus, diphtheria, and acellular pertussis (Tdap, Td) vaccine. You may need a Td booster every 10 years.  Varicella vaccine. You may need this if you have not been vaccinated.  Zoster vaccine. You may need this after age 5.  Measles, mumps, and rubella (MMR) vaccine. You may need at least one dose of MMR if you were born in  1957 or later. You may also need a second dose.  Pneumococcal 13-valent conjugate (PCV13) vaccine. You may need this if you have certain conditions and were not previously vaccinated.  Pneumococcal polysaccharide (PPSV23) vaccine. You may need one or two doses if you smoke cigarettes or if you have certain conditions.  Meningococcal vaccine. You may need this if you have certain  conditions.  Hepatitis A vaccine. You may need this if you have certain conditions or if you travel or work in places where you may be exposed to hepatitis A.  Hepatitis B vaccine. You may need this if you have certain conditions or if you travel or work in places where you may be exposed to hepatitis B.  Haemophilus influenzae type b (Hib) vaccine. You may need this if you have certain conditions.  Talk to your health care provider about which screenings and vaccines you need and how often you need them. This information is not intended to replace advice given to you by your health care provider. Make sure you discuss any questions you have with your health care provider. Document Released: 12/29/2015 Document Revised: 08/21/2016 Document Reviewed: 10/03/2015 Elsevier Interactive Patient Education  2018 Elsevier Inc.  

## 2018-11-20 LAB — LIPID PANEL WITH LDL/HDL RATIO
Cholesterol, Total: 213 mg/dL — ABNORMAL HIGH (ref 100–199)
HDL: 49 mg/dL (ref >39)
LDL CALC: 119 mg/dL — AB (ref 0–99)
LDL/HDL RATIO: 2.4 ratio (ref 0.0–3.2)
Triglycerides: 225 mg/dL — ABNORMAL HIGH (ref 0–149)
VLDL CHOLESTEROL CAL: 45 mg/dL — AB (ref 5–40)

## 2018-11-20 LAB — CBC WITH DIFFERENTIAL/PLATELET
BASOS: 1 %
Basophils Absolute: 0.1 10*3/uL (ref 0.0–0.2)
EOS (ABSOLUTE): 0.2 10*3/uL (ref 0.0–0.4)
EOS: 3 %
HEMATOCRIT: 41.3 % (ref 34.0–46.6)
Hemoglobin: 14.9 g/dL (ref 11.1–15.9)
IMMATURE GRANS (ABS): 0 10*3/uL (ref 0.0–0.1)
IMMATURE GRANULOCYTES: 0 %
Lymphocytes Absolute: 2.9 10*3/uL (ref 0.7–3.1)
Lymphs: 34 %
MCH: 31.6 pg (ref 26.6–33.0)
MCHC: 36.1 g/dL — ABNORMAL HIGH (ref 31.5–35.7)
MCV: 88 fL (ref 79–97)
MONOS ABS: 0.5 10*3/uL (ref 0.1–0.9)
Monocytes: 6 %
NEUTROS PCT: 56 %
Neutrophils Absolute: 4.7 10*3/uL (ref 1.4–7.0)
Platelets: 329 10*3/uL (ref 150–450)
RBC: 4.71 x10E6/uL (ref 3.77–5.28)
RDW: 11.5 % — AB (ref 12.3–15.4)
WBC: 8.4 10*3/uL (ref 3.4–10.8)

## 2018-11-20 LAB — VITAMIN D 25 HYDROXY (VIT D DEFICIENCY, FRACTURES): VIT D 25 HYDROXY: 26.8 ng/mL — AB (ref 30.0–100.0)

## 2018-11-23 LAB — CYTOLOGY - PAP
Diagnosis: NEGATIVE
HPV: NOT DETECTED

## 2018-12-03 ENCOUNTER — Ambulatory Visit
Admission: RE | Admit: 2018-12-03 | Discharge: 2018-12-03 | Disposition: A | Discharge status: Home / Self Care | Payer: BLUE CROSS/BLUE SHIELD | Source: Ambulatory Visit | Attending: Advanced Practice Midwife | Admitting: Advanced Practice Midwife

## 2018-12-03 DIAGNOSIS — Z1239 Encounter for other screening for malignant neoplasm of breast: Secondary | ICD-10-CM | POA: Diagnosis not present

## 2018-12-04 ENCOUNTER — Other Ambulatory Visit: Payer: Self-pay | Admitting: Advanced Practice Midwife

## 2018-12-04 DIAGNOSIS — N6489 Other specified disorders of breast: Secondary | ICD-10-CM

## 2018-12-04 DIAGNOSIS — R928 Other abnormal and inconclusive findings on diagnostic imaging of breast: Secondary | ICD-10-CM

## 2018-12-24 ENCOUNTER — Ambulatory Visit
Admission: RE | Admit: 2018-12-24 | Discharge: 2018-12-24 | Disposition: A | Discharge status: Home / Self Care | Payer: Self-pay | Source: Ambulatory Visit | Attending: Advanced Practice Midwife | Admitting: Advanced Practice Midwife

## 2018-12-24 DIAGNOSIS — N6489 Other specified disorders of breast: Secondary | ICD-10-CM

## 2018-12-24 DIAGNOSIS — R928 Other abnormal and inconclusive findings on diagnostic imaging of breast: Secondary | ICD-10-CM | POA: Insufficient documentation

## 2019-07-08 IMAGING — US ULTRASOUND RIGHT BREAST LIMITED
1 series · 9 of 9 positions shown · non-contrast
Comparison: Previous exam(s).

CLINICAL DATA: 42-year-old female recalled from baseline screening
mammogram dated 12/03/2018 for a possible right breast asymmetry.

EXAM:
DIGITAL DIAGNOSTIC RIGHT MAMMOGRAM WITH CAD AND TOMO
ULTRASOUND RIGHT BREAST

[Series 1: ultrasound right breast limited · 0.05mm/px · 9 of 9 slices shown]
[im 1/9]
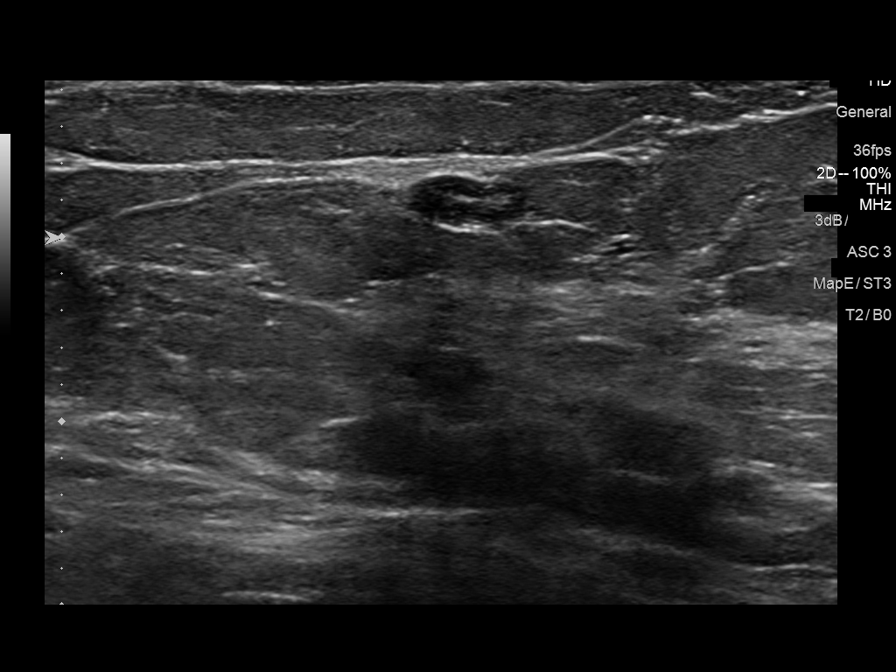
[im 2/9]
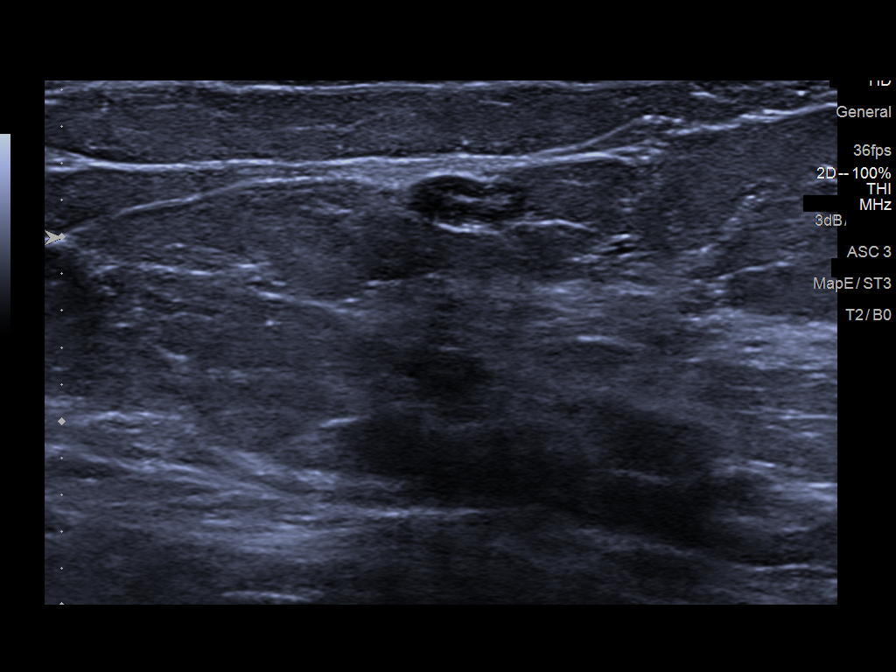
[im 3/9]
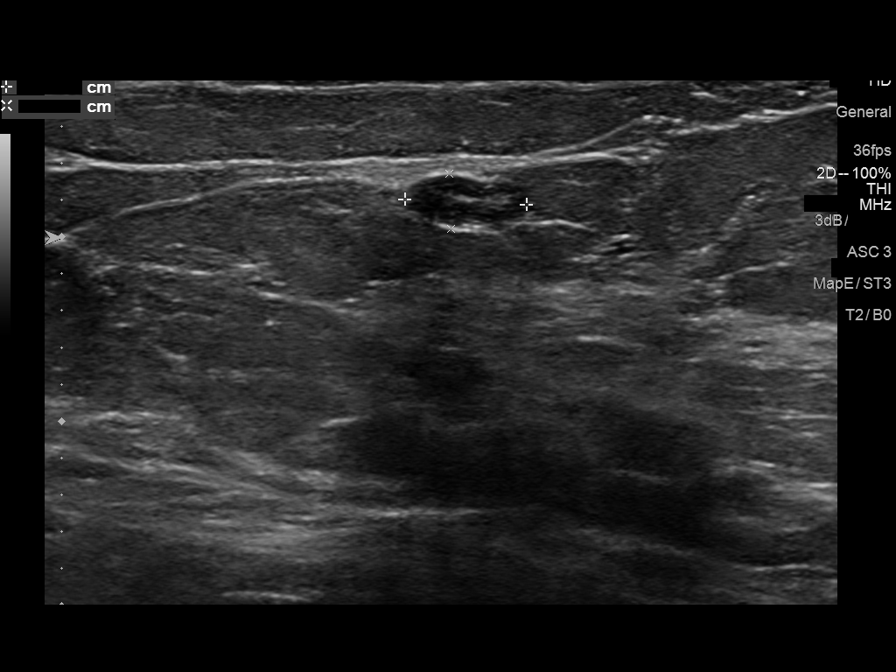
[im 4/9]
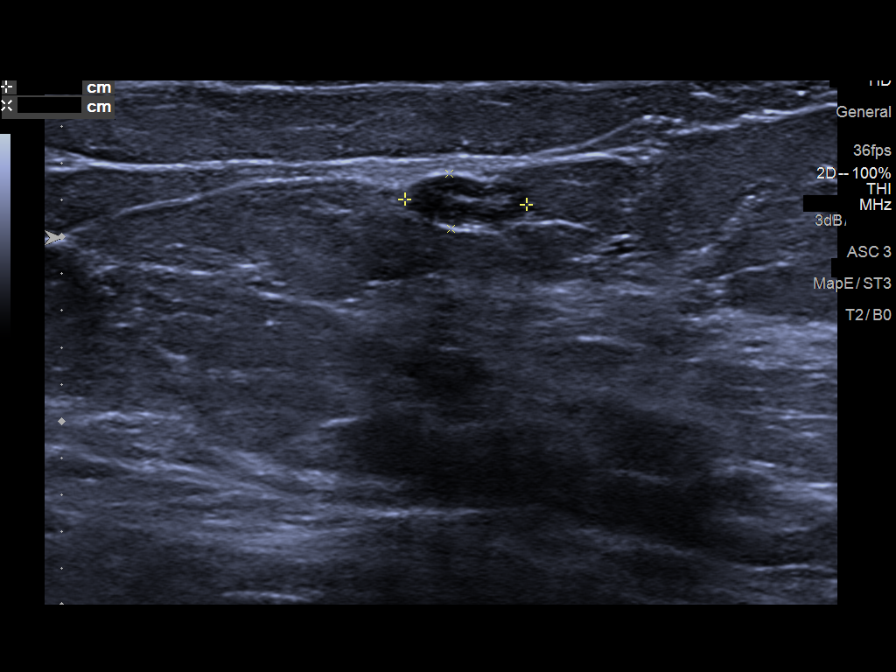
[im 5/9]
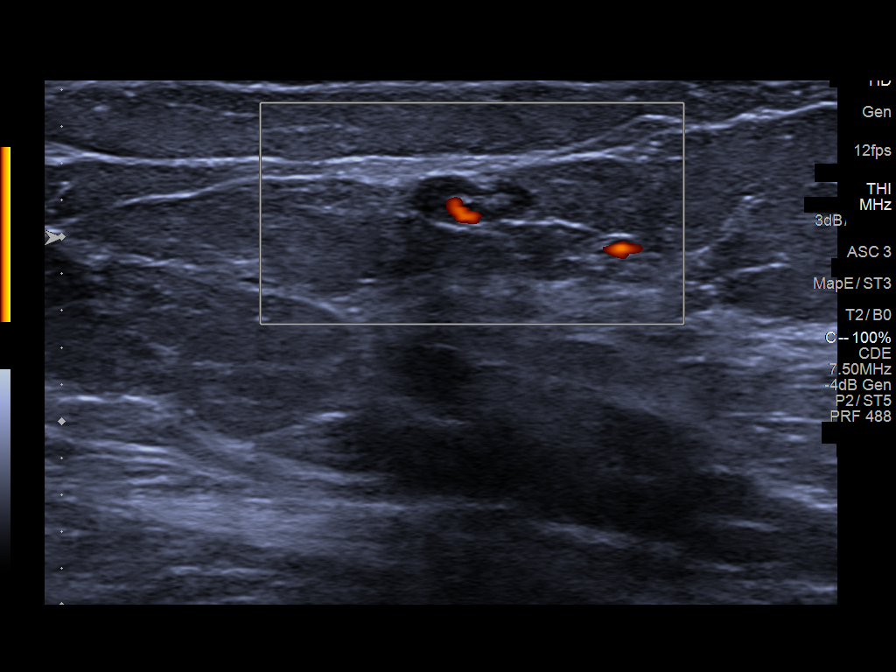
[im 6/9]
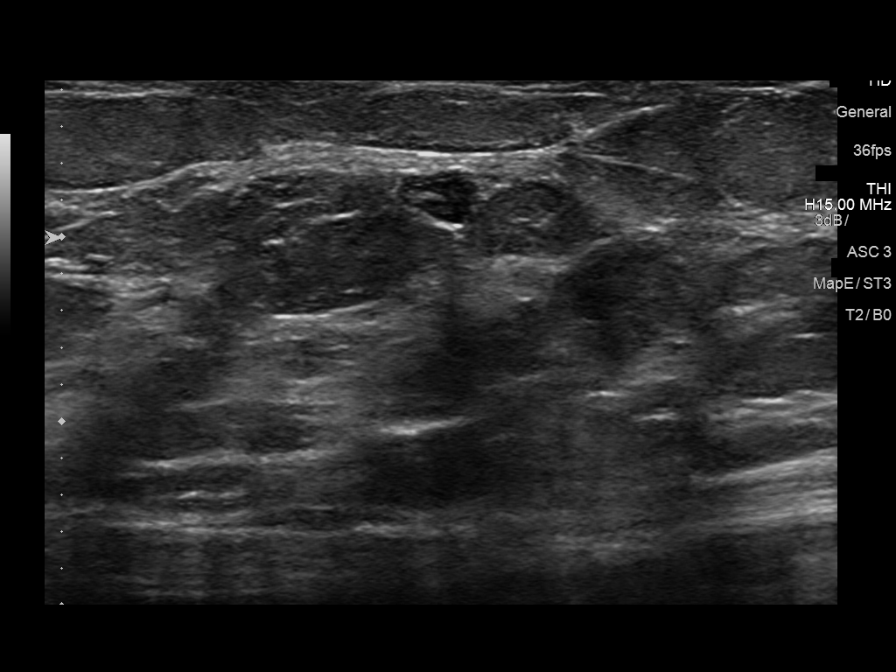
[im 7/9]
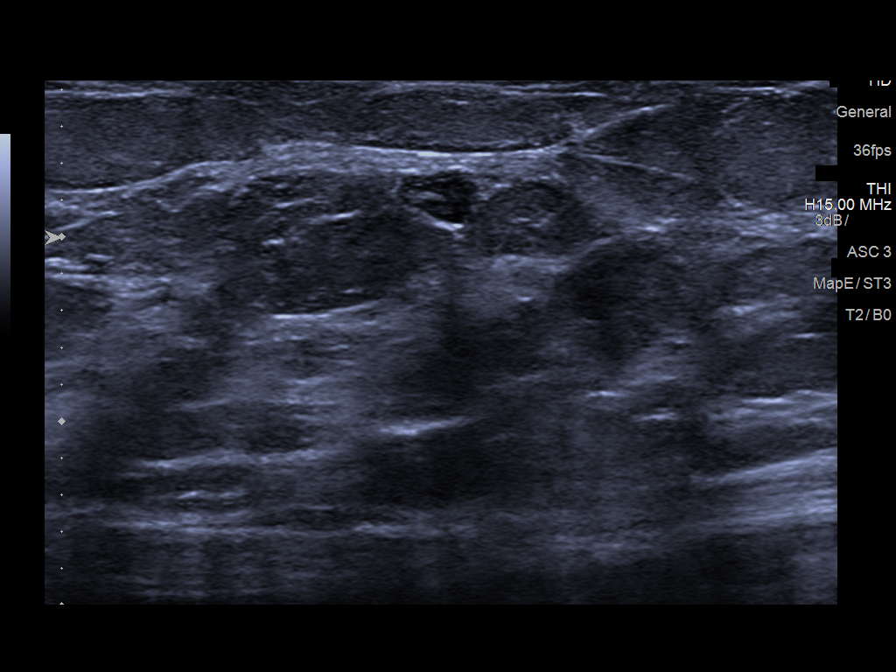
[im 8/9]
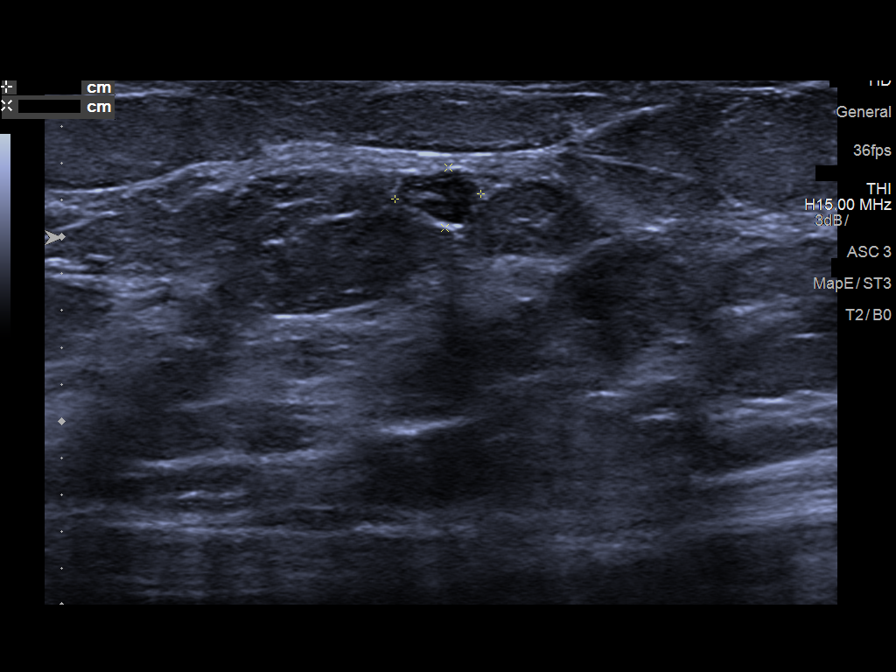
[im 9/9]
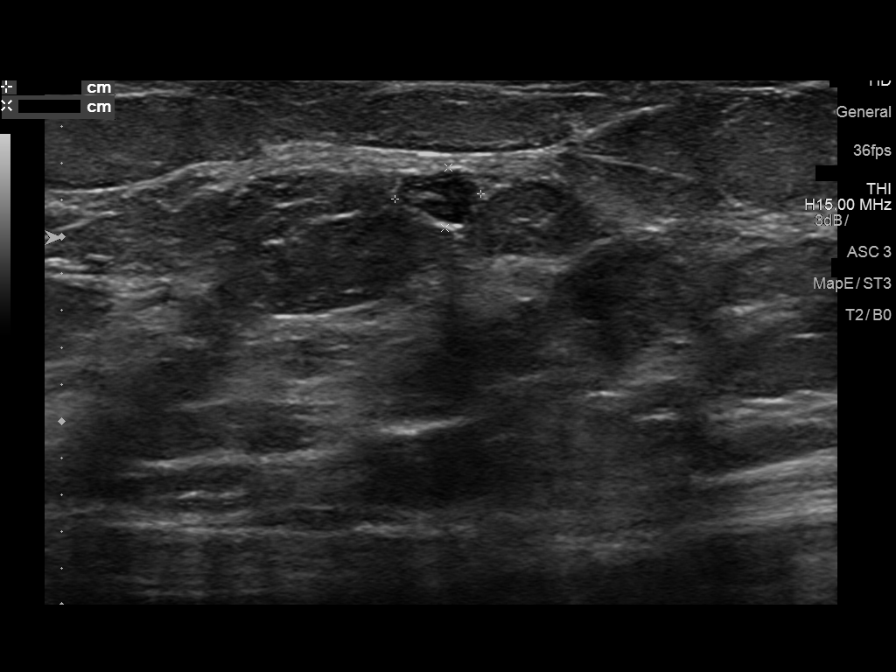

[9 of 9 positions shown; findings below may reference images not displayed]

ACR Breast Density Category c: The breast tissue is heterogeneously
dense, which may obscure small masses.
FINDINGS: Previously noted asymmetry in the lateral right breast persists as a
circumscribed equal density mass. There is suggestion of a fatty
hilum. Confirmatory ultrasound was performed.

Mammographic images were processed with CAD.

Targeted ultrasound is performed, showing a morphologically normal
lymph node at the 9 o'clock position 4 cm from the nipple. It
measures 7 x 5 x 3 mm. This correlates with the mammographic
finding.
IMPRESSION: Benign intramammary lymph node corresponding with the screening
mammographic findings. No further follow-up required.

RECOMMENDATION:
Screening mammogram in one year.(Code:SA-0-AQ9)

I have discussed the findings and recommendations with the patient.
Results were also provided in writing at the conclusion of the
visit. If applicable, a reminder letter will be sent to the patient
regarding the next appointment.

BI-RADS CATEGORY  2: Benign.

## 2019-12-21 ENCOUNTER — Other Ambulatory Visit: Payer: Self-pay | Admitting: Family Medicine

## 2019-12-21 DIAGNOSIS — Z1231 Encounter for screening mammogram for malignant neoplasm of breast: Secondary | ICD-10-CM

## 2020-01-04 NOTE — Progress Notes (Signed)
Prescreened patient for BCCCP eligibility using two patient identifiers;  Denies breast problems.  Last pap 11/19/18 negative/negative.  Patient will go directly to [facility] at 3:00 on 01/05/20.  Risk Assessment    Risk Scores      01/05/2020   Last edited by: Scarlett Presto, RN   5-year risk: 0.7 %   Lifetime risk: 11 %

## 2020-01-05 ENCOUNTER — Other Ambulatory Visit: Payer: Self-pay

## 2020-01-05 ENCOUNTER — Ambulatory Visit
Admission: RE | Admit: 2020-01-05 | Discharge: 2020-01-05 | Disposition: A | Discharge status: Home / Self Care | Payer: Self-pay | Source: Ambulatory Visit | Attending: Oncology | Admitting: Oncology

## 2020-01-05 ENCOUNTER — Ambulatory Visit: Payer: Self-pay | Attending: Oncology

## 2020-01-05 DIAGNOSIS — Z0000 Encounter for general adult medical examination without abnormal findings: Secondary | ICD-10-CM

## 2020-01-06 NOTE — Progress Notes (Signed)
Letter mailed from [facility] to notify of normal mammogram results.  Patient to return in one year for annual screening.  Copy to [facility]. 

## 2020-02-19 ENCOUNTER — Ambulatory Visit: Payer: Self-pay

## 2020-02-26 ENCOUNTER — Ambulatory Visit: Payer: Self-pay | Attending: Internal Medicine

## 2020-02-26 DIAGNOSIS — Z23 Encounter for immunization: Secondary | ICD-10-CM

## 2020-02-26 NOTE — Progress Notes (Signed)
   Covid-19 Vaccination Clinic  Name:  Cathy Owens    MRN: [id number] DOB: March 22, 1976  02/26/2020  Cathy Owens was observed post Covid-19 immunization for 15 minutes without incident. She was provided with Vaccine Information Sheet and instruction to access the V-Safe system.   Cathy Owens was instructed to call 911 with any severe reactions post vaccine: Marland Kitchen Difficulty breathing  . Swelling of face and throat  . A fast heartbeat  . A bad rash all over body  . Dizziness and weakness   Immunizations Administered    Name Date Dose VIS Date Route   Pfizer COVID-19 Vaccine 02/26/2020  8:47 AM 0.3 mL 11/26/2019 Intramuscular   Manufacturer: [employer], [employer]   Lot: [id number]   NDC: [address]-5

## 2020-03-18 ENCOUNTER — Ambulatory Visit: Payer: Self-pay | Attending: Internal Medicine

## 2020-03-18 ENCOUNTER — Ambulatory Visit: Payer: Self-pay

## 2020-03-18 DIAGNOSIS — Z23 Encounter for immunization: Secondary | ICD-10-CM

## 2020-03-18 NOTE — Progress Notes (Signed)
   Covid-19 Vaccination Clinic  Name:  Cathy Owens    MRN: [id number] DOB: 1976-11-16  03/18/2020  [name] was observed post Covid-19 immunization for 15 minutes without incident. She was provided with Vaccine Information Sheet and instruction to access the V-Safe system.   [name] was instructed to call 911 with any severe reactions post vaccine: Marland Kitchen Difficulty breathing  . Swelling of face and throat  . A fast heartbeat  . A bad rash all over body  . Dizziness and weakness   Immunizations Administered    Name Date Dose VIS Date Route   Pfizer COVID-19 Vaccine 03/18/2020  8:14 AM 0.3 mL 11/26/2019 Intramuscular   Manufacturer: [employer], [employer]   Lot: [id number]   NDC: [address]-7

## 2020-03-27 ENCOUNTER — Ambulatory Visit: Payer: Self-pay

## 2020-10-14 IMAGING — MG MM DIGITAL DIAGNOSTIC UNILAT*R* W/ TOMO W/ CAD
4 series · 4 of 12 positions shown · non-contrast
Comparison: Previous exam(s).

CLINICAL DATA: 42-year-old female recalled from baseline screening
mammogram dated 12/03/2018 for a possible right breast asymmetry.

EXAM:
DIGITAL DIAGNOSTIC RIGHT MAMMOGRAM WITH CAD AND TOMO
ULTRASOUND RIGHT BREAST

[R CC synth-2D]
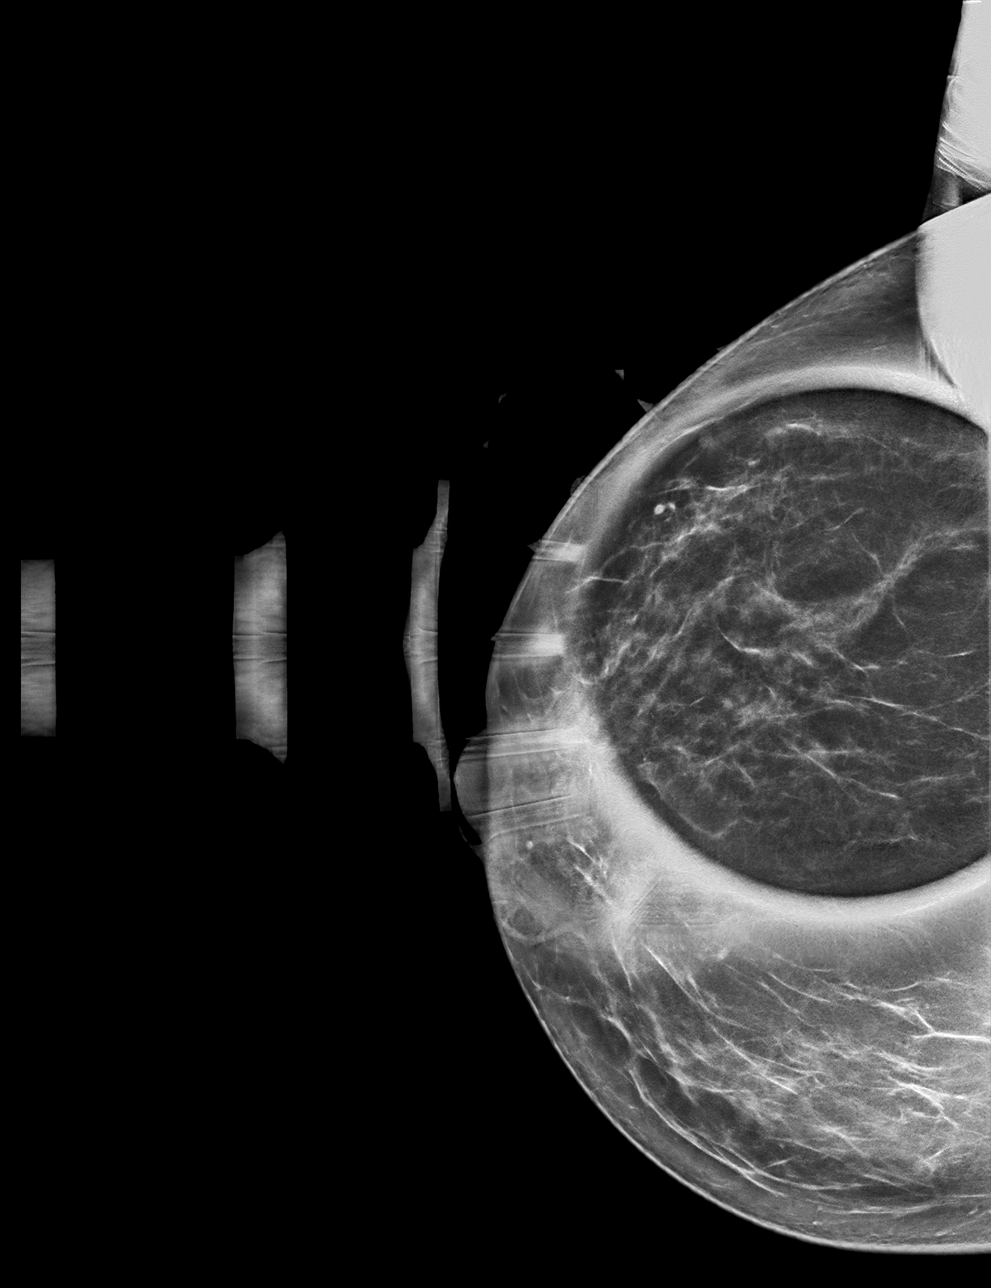

[R MLO synth-2D]
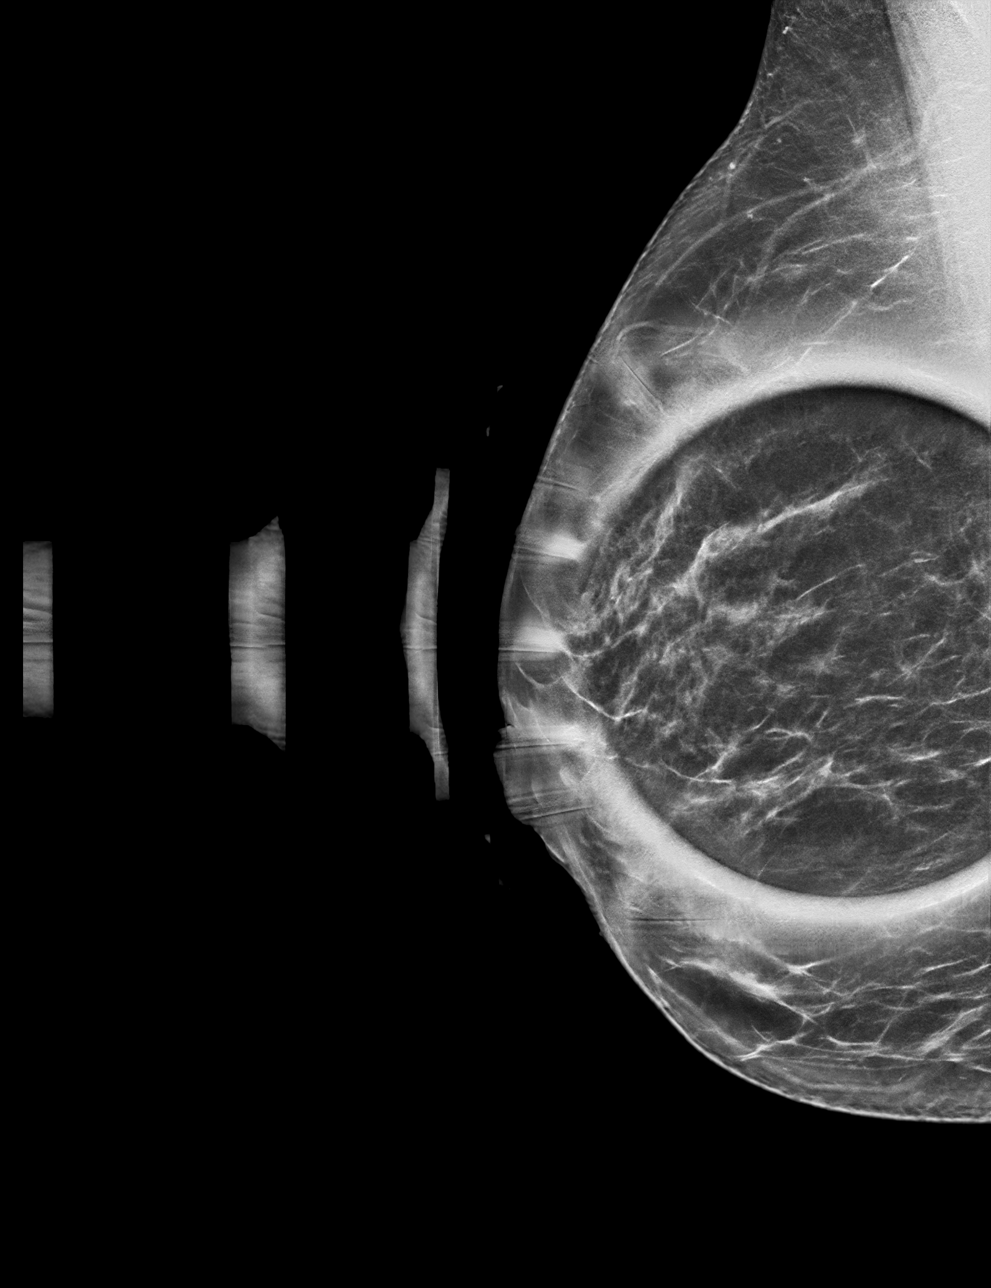

[R MLO tomo · tomo slice 33/66.0]
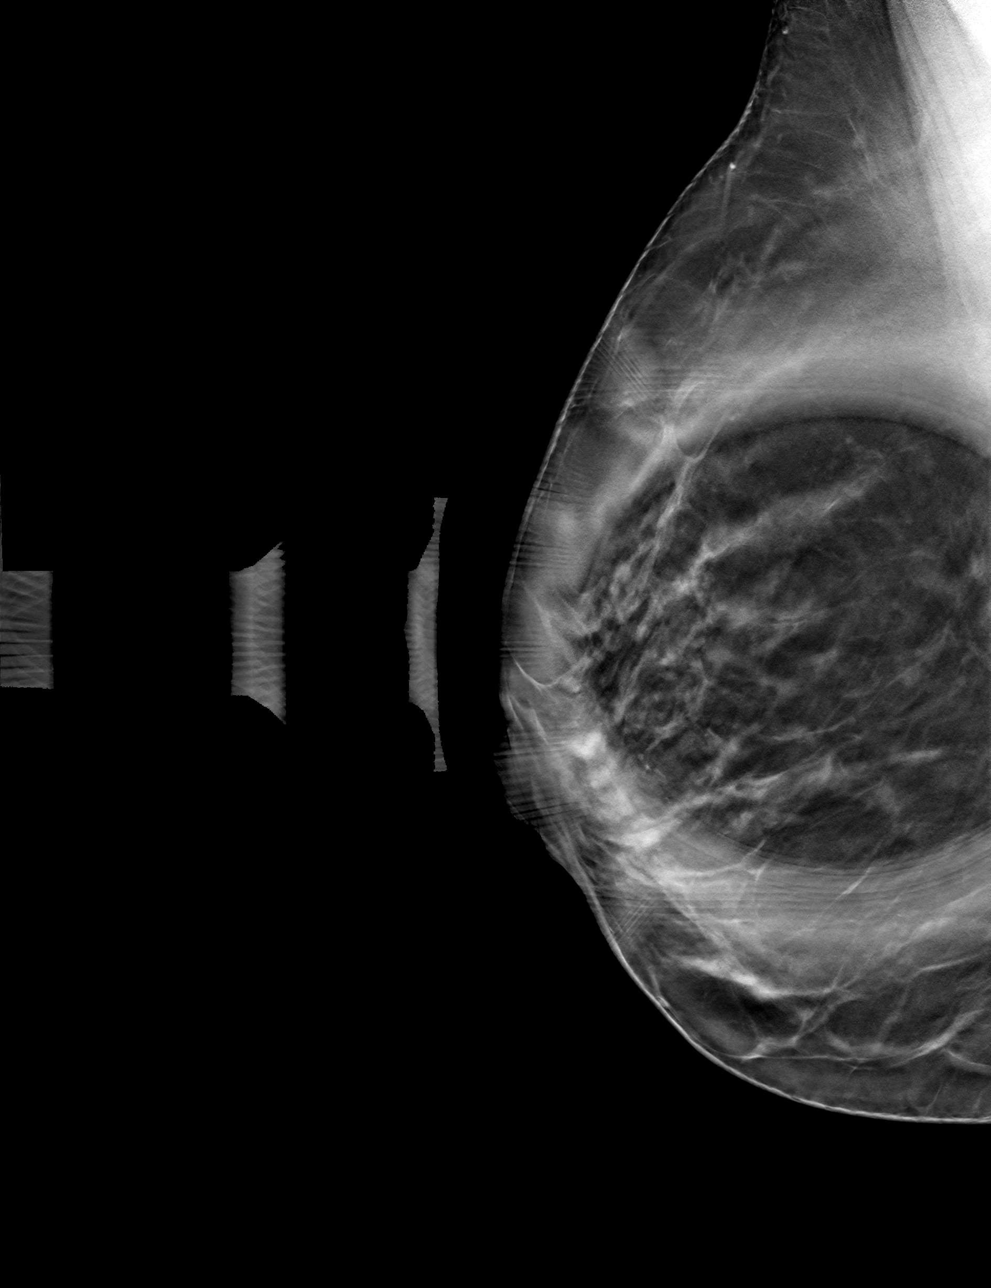

[R CC tomo · tomo slice 33/64.0]
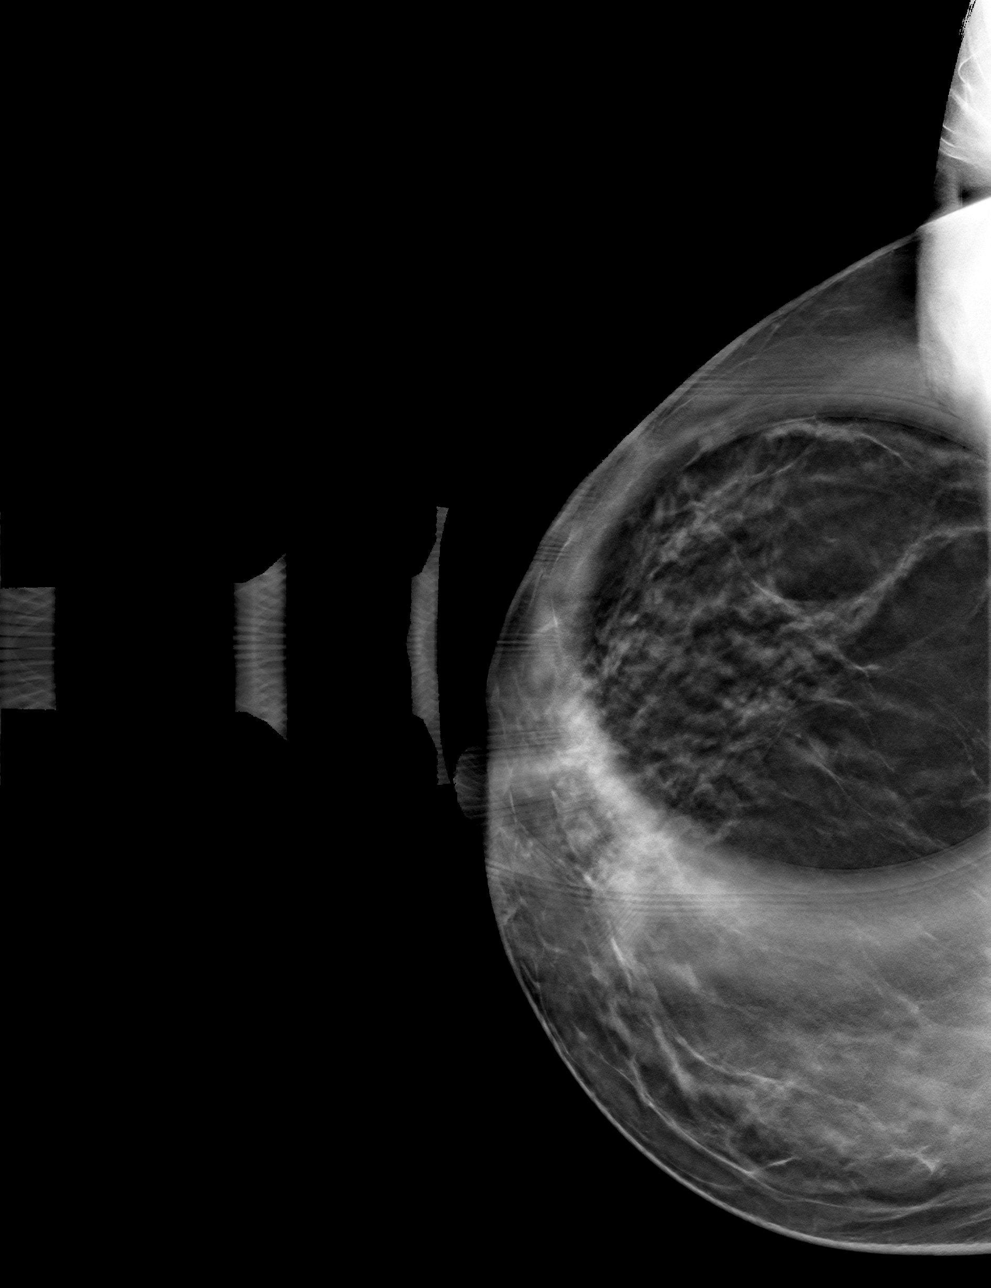

[4 of 12 positions shown; findings below may reference images not displayed]

ACR Breast Density Category c: The breast tissue is heterogeneously
dense, which may obscure small masses.
FINDINGS: Previously noted asymmetry in the lateral right breast persists as a
circumscribed equal density mass. There is suggestion of a fatty
hilum. Confirmatory ultrasound was performed.

Mammographic images were processed with CAD.

Targeted ultrasound is performed, showing a morphologically normal
lymph node at the 9 o'clock position 4 cm from the nipple. It
measures 7 x 5 x 3 mm. This correlates with the mammographic
finding.
IMPRESSION: Benign intramammary lymph node corresponding with the screening
mammographic findings. No further follow-up required.

RECOMMENDATION:
Screening mammogram in one year.(Code:SA-0-AQ9)

I have discussed the findings and recommendations with the patient.
Results were also provided in writing at the conclusion of the
visit. If applicable, a reminder letter will be sent to the patient
regarding the next appointment.

BI-RADS CATEGORY  2: Benign.

## 2020-11-28 ENCOUNTER — Other Ambulatory Visit
Admission: RE | Admit: 2020-11-28 | Discharge: 2020-11-28 | Disposition: A | Discharge status: Home / Self Care | Payer: Self-pay | Source: Ambulatory Visit | Attending: Family Medicine | Admitting: Family Medicine

## 2020-11-28 DIAGNOSIS — R0781 Pleurodynia: Secondary | ICD-10-CM | POA: Insufficient documentation

## 2020-11-28 DIAGNOSIS — Z8616 Personal history of COVID-19: Secondary | ICD-10-CM | POA: Insufficient documentation

## 2020-11-28 LAB — FIBRIN DERIVATIVES D-DIMER (ARMC ONLY): Fibrin derivatives D-dimer (ARMC): 454.81 ng/mL (FEU) (ref 0.00–499.00)

## 2020-12-07 ENCOUNTER — Emergency Department: Admit: 2020-12-07 | Payer: 344 | Primary: Student in an Organized Health Care Education/Training Program

## 2020-12-07 ENCOUNTER — Inpatient Hospital Stay
Admit: 2020-12-07 | Discharge: 2020-12-07 | Disposition: A | Discharge status: Home / Self Care | Payer: 344 | Attending: Emergency Medicine

## 2020-12-07 DIAGNOSIS — R091 Pleurisy: Secondary | ICD-10-CM

## 2020-12-07 LAB — COMPREHENSIVE METABOLIC PANEL
ALT: 30 U/L (ref 12–78)
AST: 16 U/L (ref 15–37)
Albumin/Globulin Ratio: 0.9 — ABNORMAL LOW (ref 1.0–1.5)
Albumin: 3.4 g/dL (ref 3.4–5.0)
Alkaline Phosphatase: 65 U/L (ref 46–116)
Anion Gap: 6 mmol/L (ref 4–12)
BUN: 19 mg/dL — ABNORMAL HIGH (ref 7–18)
CO2: 31 mmol/L (ref 21–32)
Calcium: 8.9 mg/dL (ref 8.5–10.1)
Chloride: 102 mmol/L (ref 98–107)
Creatinine: 0.8 mg/dL (ref 0.55–1.02)
EGFR IF NonAfrican American: >60 mL/min/{1.73_m2} (ref >60)
GFR African American: >60 mL/min/{1.73_m2} (ref >60)
Globulin: 3.7 g/dL (ref 2.8–3.9)
Glucose: 88 mg/dL (ref 74–106)
Potassium: 3.9 mmol/L (ref 3.5–5.1)
Sodium: 139 mmol/L (ref 136–145)
Total Bilirubin: 0.3 mg/dL (ref 0.2–1.0)
Total Protein: 7.1 g/dL (ref 6.4–8.2)

## 2020-12-07 LAB — CBC WITH AUTO DIFFERENTIAL
Basophils %: 0 % (ref 0.0–3.0)
Basophils Absolute: 0 10*3/uL (ref 0.0–0.4)
Eosinophils %: 4 % (ref 0.0–7.0)
Eosinophils Absolute: 0.4 10*3/uL (ref 0.0–1.0)
Granulocyte Absolute Count: 0.1 10*3/uL (ref 0.0–0.17)
Hematocrit: 42.3 % (ref 36.0–47.0)
Hemoglobin: 14.8 g/dL (ref 12.0–16.0)
Immature Granulocytes: 1 % — ABNORMAL HIGH (ref 0.0–0.5)
Lymphocytes %: 27 % (ref 18.0–40.0)
Lymphocytes Absolute: 2.4 10*3/uL (ref 0.9–4.2)
MCH: 31.9 PG (ref 27.0–35.0)
MCHC: 35 g/dL (ref 30.7–37.3)
MCV: 91.2 FL (ref 81.0–94.0)
MPV: 8.3 FL — ABNORMAL LOW (ref 9.2–11.8)
Monocytes %: 9 % (ref 2.0–12.0)
Monocytes Absolute: 0.8 10*3/uL (ref 0.1–1.7)
NRBC Absolute: 0 10*3/uL (ref 0.0–0.01)
Neutrophils %: 58 % (ref 48.0–72.0)
Neutrophils Absolute: 5.2 10*3/uL (ref 2.3–7.6)
Nucleated RBCs: 0 PER 100 WBC
Platelets: 278 10*3/uL (ref 130–400)
RBC: 4.64 M/uL (ref 4.20–5.40)
RDW: 11.6 % (ref 11.5–14.0)
WBC: 9 10*3/uL (ref 4.8–10.6)

## 2020-12-07 LAB — D-DIMER, QUANTITATIVE: D DIMER,DDMR: <100 ng/mL (D-DU) (ref 0–400)

## 2020-12-07 LAB — CBC WITH AUTOMATED DIFF
ABS. BASOPHILS: 0 10*3/uL (ref 0.0–0.4)
ABS. EOSINOPHILS: 0.4 10*3/uL (ref 0.0–1.0)
ABS. IMM. GRANS.: 0.1 10*3/uL (ref 0.0–0.17)
ABS. LYMPHOCYTES: 2.4 10*3/uL (ref 0.9–4.2)
ABS. MONOCYTES: 0.8 10*3/uL (ref 0.1–1.7)
ABS. NEUTROPHILS: 5.2 10*3/uL (ref 2.3–7.6)
ABSOLUTE NRBC: 0 10*3/uL (ref 0.0–0.01)
BASOPHILS: 0 % (ref 0.0–3.0)
EOSINOPHILS: 4 % (ref 0.0–7.0)
HCT: 42.3 % (ref 36.0–47.0)
HGB: 14.8 g/dL (ref 12.0–16.0)
IMMATURE GRANULOCYTES: 1 % — ABNORMAL HIGH (ref 0.0–0.5)
LYMPHOCYTES: 27 % (ref 18.0–40.0)
MCH: 31.9 PG (ref 27.0–35.0)
MCHC: 35 g/dL (ref 30.7–37.3)
MCV: 91.2 FL (ref 81.0–94.0)
MONOCYTES: 9 % (ref 2.0–12.0)
MPV: 8.3 FL — ABNORMAL LOW (ref 9.2–11.8)
NEUTROPHILS: 58 % (ref 48.0–72.0)
NRBC: 0 PER 100 WBC
PLATELET: 278 10*3/uL (ref 130–400)
RBC: 4.64 M/uL (ref 4.20–5.40)
RDW: 11.6 % (ref 11.5–14.0)
WBC: 9 10*3/uL (ref 4.8–10.6)

## 2020-12-07 LAB — METABOLIC PANEL, COMPREHENSIVE
A-G Ratio: 0.9 — ABNORMAL LOW (ref 1.0–1.5)
ALT (SGPT): 30 U/L (ref 12–78)
AST (SGOT): 16 U/L (ref 15–37)
Albumin: 3.4 g/dL (ref 3.4–5.0)
Alk. phosphatase: 65 U/L (ref 46–116)
Anion gap: 6 mmol/L (ref 4–12)
BUN: 19 mg/dL — ABNORMAL HIGH (ref 7–18)
Bilirubin, total: 0.3 mg/dL (ref 0.2–1.0)
CO2: 31 mmol/L (ref 21–32)
Calcium: 8.9 mg/dL (ref 8.5–10.1)
Chloride: 102 mmol/L (ref 98–107)
Creatinine: 0.8 mg/dL (ref 0.55–1.02)
GFR est AA: >60 mL/min/{1.73_m2} (ref >60)
GFR est non-AA: >60 mL/min/{1.73_m2} (ref >60)
Globulin: 3.7 g/dL (ref 2.8–3.9)
Glucose: 88 mg/dL (ref 74–106)
Potassium: 3.9 mmol/L (ref 3.5–5.1)
Protein, total: 7.1 g/dL (ref 6.4–8.2)
Sodium: 139 mmol/L (ref 136–145)

## 2020-12-07 LAB — D DIMER: D-dimer: <100 ng/mL (D-DU) (ref 0–400)

## 2020-12-07 MED ORDER — PREDNISONE 20 MG TAB
20 mg | ORAL_TABLET | Freq: Two times a day (BID) | ORAL | 0 refills | Status: AC
Start: 2020-12-07 — End: 2020-12-14

## 2020-12-07 MED ORDER — IOPAMIDOL 76 % IV SOLN
76 % | Freq: Once | INTRAVENOUS | Status: AC
Start: 2020-12-07 — End: 2020-12-07
  Administered 2020-12-07: 19:00:00 via INTRAVENOUS

## 2020-12-07 MED ORDER — SODIUM CHLORIDE 0.9% BOLUS IV
0.9 % | Freq: Once | INTRAVENOUS | Status: AC
Start: 2020-12-07 — End: 2020-12-07
  Administered 2020-12-07: 18:00:00 via INTRAVENOUS

## 2020-12-07 MED ORDER — PREDNISONE 20 MG TAB
20 mg | ORAL | Status: AC
Start: 2020-12-07 — End: 2020-12-07
  Administered 2020-12-07: 21:00:00 via ORAL

## 2020-12-07 MED FILL — PREDNISONE 20 MG TAB: 20 mg | ORAL | Qty: 3

## 2020-12-07 MED FILL — SODIUM CHLORIDE 0.9 % IV: INTRAVENOUS | Qty: 1000

## 2020-12-07 MED FILL — ISOVUE-370  76 % INTRAVENOUS SOLUTION: 370 mg iodine /mL (76 %) | INTRAVENOUS | Qty: 100

## 2020-12-07 NOTE — ED Provider Notes (Signed)
`      HPI    44 year old female, who was diagnosed with Covid, on 11/08/2020  She was not hospitalized for this  He states 1 week ago she developed a left mid axillary line pleuritic chest pain  She was seen by her primary care doctor, who got a chest x-ray which was reportedly negative, and a D-dimer of 459  She was started on a prednisone taper, which did make her feel a bit better  She stopped the prednisone on Monday, and the pleuritic left mid axillary line chest pain came back on  She denies any shortness of breath with this  She denies any cough or sputum  She denies any other chest pain  She denies any leg swelling  She denies any fevers or chills at this time  She denies any other complaints at this time, the remainder the review of systems is negative      Past Medical History:   Diagnosis Date   ??? COVID-19        Past Surgical History:   Procedure Laterality Date   ??? HX CESAREAN SECTION     ??? HX CHOLECYSTECTOMY           History reviewed. No pertinent family history.    Social History     Socioeconomic History   ??? Marital status: MARRIED     Spouse name: Not on file   ??? Number of children: Not on file   ??? Years of education: Not on file   ??? Highest education level: Not on file   Occupational History   ??? Not on file   Tobacco Use   ??? Smoking status: Never Smoker   ??? Smokeless tobacco: Never Used   Substance and Sexual Activity   ??? Alcohol use: Never   ??? Drug use: Never   ??? Sexual activity: Not on file   Other Topics Concern   ??? Not on file   Social History Narrative   ??? Not on file     Social Determinants of Health     Financial Resource Strain:    ??? Difficulty of Paying Living Expenses: Not on file   Food Insecurity:    ??? Worried About Running Out of Food in the Last Year: Not on file   ??? Ran Out of Food in the Last Year: Not on file   Transportation Needs:    ??? Lack of Transportation (Medical): Not on file   ??? Lack of Transportation (Non-Medical): Not on file   Physical Activity:    ??? Days of Exercise  per Week: Not on file   ??? Minutes of Exercise per Session: Not on file   Stress:    ??? Feeling of Stress : Not on file   Social Connections:    ??? Frequency of Communication with Friends and Family: Not on file   ??? Frequency of Social Gatherings with Friends and Family: Not on file   ??? Attends Religious Services: Not on file   ??? Active Member of Clubs or Organizations: Not on file   ??? Attends Archivist Meetings: Not on file   ??? Marital Status: Not on file   Intimate Partner Violence:    ??? Fear of Current or Ex-Partner: Not on file   ??? Emotionally Abused: Not on file   ??? Physically Abused: Not on file   ??? Sexually Abused: Not on file   Housing Stability:    ??? Unable to Pay for Housing  in the Last Year: Not on file   ??? Number of Places Lived in the Last Year: Not on file   ??? Unstable Housing in the Last Year: Not on file         ALLERGIES: Bactrim [sulfamethoprim], Pcn [penicillins], and Sulfa (sulfonamide antibiotics)    Review of Systems     12 point review of systems is as per history of present illness and otherwise negative    Vitals:    12/07/20 1116   BP: (!) 152/105   Pulse: 99   Resp: 22   Temp: 98.1 ??F (36.7 ??C)   SpO2: 100%   Weight: 65.8 kg (145 lb)   Height: 5' 8.5" (1.74 m)            Physical Exam     GENERAL: The patient is awake, alert, and fully oriented, and in no   apparent distress.   HEAD: Normal with no signs of trauma.   EYES: sclera anicteric, conjunctiva are normal.   ENT: Moist mucous membranes.   NECK: Normal range of motion, supple .   LUNGS: Breath sounds equal, clear to auscultation bilaterally.  No   wheezes, and no crackles.   HEART: Regular rate and rhythm, normal S1 and S2 without murmur, rub   or gallop.   ABDOMEN: Soft, nontender, normoactive bowel sounds.  No guarding, no   rebound.  No masses appreciated.   EXTREMITIES: Normal range of motion, no edema.  No clubbing or   cyanosis. No cords, erythema, or tenderness.   NEUROLOGICAL: Cranial nerves II through XII grossly  intact.  Normal   speech, normal gait.   PSYCH: Normal mood, normal affect.   SKIN: Warm, Dry, normal turgor, no rashes or lesions noted.    MDM      Given the recent Covid, I am concerned about PE    Results for orders placed or performed during the hospital encounter of 12/07/20   CBC WITH AUTOMATED DIFF   Result Value Ref Range    WBC 9.0 4.8 - 10.6 K/uL    RBC 4.64 4.20 - 5.40 M/uL    HGB 14.8 12.0 - 16.0 g/dL    HCT 42.3 36.0 - 47.0 %    MCV 91.2 81.0 - 94.0 FL    MCH 31.9 27.0 - 35.0 PG    MCHC 35.0 30.7 - 37.3 g/dL    RDW 11.6 11.5 - 14.0 %    PLATELET 278 130 - 400 K/uL    MPV 8.3 (L) 9.2 - 11.8 FL    NRBC 0.0 0 PER 100 WBC    ABSOLUTE NRBC 0.00 0.0 - 0.01 K/uL    NEUTROPHILS 58 48.0 - 72.0 %    LYMPHOCYTES 27 18.0 - 40.0 %    MONOCYTES 9 2.0 - 12.0 %    EOSINOPHILS 4 0.0 - 7.0 %    BASOPHILS 0 0.0 - 3.0 %    IMMATURE GRANULOCYTES 1 (H) 0.0 - 0.5 %    ABS. NEUTROPHILS 5.2 2.3 - 7.6 K/UL    ABS. LYMPHOCYTES 2.4 0.9 - 4.2 K/UL    ABS. MONOCYTES 0.8 0.1 - 1.7 K/UL    ABS. EOSINOPHILS 0.4 0.0 - 1.0 K/UL    ABS. BASOPHILS 0.0 0.0 - 0.4 K/UL    ABS. IMM. GRANS. 0.1 0.0 - 0.17 K/UL    DF AUTOMATED     D DIMER   Result Value Ref Range    D-dimer <100 0 - 400 ng/mL (D-DU)  METABOLIC PANEL, COMPREHENSIVE   Result Value Ref Range    Sodium 139 136 - 145 mmol/L    Potassium 3.9 3.5 - 5.1 mmol/L    Chloride 102 98 - 107 mmol/L    CO2 31 21 - 32 mmol/L    Anion gap 6 4 - 12 mmol/L    Glucose 88 74 - 106 mg/dL    BUN 19 (H) 7 - 18 mg/dL    Creatinine 0.80 0.55 - 1.02 mg/dL    GFR est AA >60 >60 ml/min/1.15m    GFR est non-AA >60 >60 ml/min/1.729m   Calcium 8.9 8.5 - 10.1 mg/dL    Bilirubin, total 0.3 0.2 - 1.0 mg/dL    ALT (SGPT) 30 12 - 78 U/L    AST (SGOT) 16 15 - 37 U/L    Alk. phosphatase 65 46 - 116 U/L    Protein, total 7.1 6.4 - 8.2 g/dL    Albumin 3.4 3.4 - 5.0 g/dL    Globulin 3.7 2.8 - 3.9 g/dL    A-G Ratio 0.9 (L) 1.0 - 1.5     EKG, 12 LEAD, INITIAL   Result Value Ref Range    Ventricular Rate 77 BPM     Atrial Rate 77 BPM    P-R Interval 146 ms    QRS Duration 92 ms    Q-T Interval 376 ms    QTC Calculation (Bezet) 425 ms    Calculated P Axis 60 degrees    Calculated R Axis 61 degrees    Calculated T Axis 47 degrees    Diagnosis       Normal sinus rhythm  Normal ECG  No previous ECGs available       CTA CHEST W OR W WO CONT    Result Date: 12/07/2020  CT Angiogram Of The chest with Contrast. History: Left pleuritic chest pain. Recent Covid.. Marland KitchenRIOR EXAMS: None Technique:  100 cc of Isovue 370 from a 100 cc vile was infused intravenously. Axial, sagittal, coronal reconstructed images are being evaluated. 3-D reconstructed images were evaluated on an independent workstation. Using manufacturer's algorithms the examination was performed to optimize image quality while utilizing the lowest possible radiation dose. One or more of the following doses reduction techniques was used: Decreasing the mA and or kV, automatic exposure control and iterative reconstruction algorithm.. Findings: There is no evidence of a pulmonary embolus. Central pulmonary arteries are patent. No infiltrate, pleural effusion, or pneumothorax is seen. There is a 2 mm calcified nodule in the right upper lobe as seen on axial image 44 series 401. No mediastinal or hilar lymphadenopathy is seen. The heart is normal in size. There is no pericardial effusion. Aorta is normal in caliber. There is no aortic dissection. Osseous structures are intact. Limited imaging of the upper abdomen is unremarkable.     : No evidence of a pulmonary embolus. No acute pulmonary disease. 2 mm calcified nodule in the right upper lobe.      No infiltrate, no PE, no acute pulmonary pathology  Most likely residual pleurisy from recent Covid infection  Will resume steroids    Impression-  Encounter Diagnoses     ICD-10-CM ICD-9-CM   1. Pleurisy  R09.1 511.0

## 2020-12-07 NOTE — ED Notes (Signed)
I have reviewed discharge instructions with the patient.  The patient verbalized understanding.

## 2020-12-07 NOTE — ED Notes (Signed)
Presents with pain under left axilla and shortness of breath; original symptoms started approx a week ago Monday; was worked up and diagnosed with plueresy; tx with prednison and motrin; [facility]; presents with increase of pain and difficulty breathing

## 2020-12-08 LAB — EKG 12-LEAD
Atrial Rate: 77 {beats}/min
Diagnosis: NORMAL
P Axis: 60 degrees
P-R Interval: 146 ms
Q-T Interval: 376 ms
QRS Duration: 92 ms
QTc Calculation (Bazett): 425 ms
R Axis: 61 degrees
T Axis: 47 degrees
Ventricular Rate: 77 {beats}/min

## 2020-12-08 LAB — EKG, 12 LEAD, INITIAL
Atrial Rate: 77 {beats}/min
Calculated P Axis: 60 degrees
Calculated R Axis: 61 degrees
Calculated T Axis: 47 degrees
Diagnosis: NORMAL
P-R Interval: 146 ms
Q-T Interval: 376 ms
QRS Duration: 92 ms
QTC Calculation (Bezet): 425 ms
Ventricular Rate: 77 {beats}/min

## 2021-02-07 ENCOUNTER — Ambulatory Visit: Payer: Self-pay | Attending: Oncology

## 2021-02-07 ENCOUNTER — Other Ambulatory Visit: Payer: Self-pay

## 2021-02-07 ENCOUNTER — Ambulatory Visit
Admission: RE | Admit: 2021-02-07 | Discharge: 2021-02-07 | Disposition: A | Discharge status: Home / Self Care | Payer: Self-pay | Source: Ambulatory Visit | Attending: Oncology | Admitting: Oncology

## 2021-02-07 DIAGNOSIS — Z0000 Encounter for general adult medical examination without abnormal findings: Secondary | ICD-10-CM | POA: Insufficient documentation

## 2021-02-07 NOTE — Progress Notes (Signed)
Patient pre-screened for BCCCP eligibility due to COVID 19 precautions. Two patient identifiers used for verification that I was speaking to correct patient.  Patient to Present directly to [facility] today for BCCCP screening mammogram. Risk Assessment    Risk Scores      02/07/2021 01/05/2020   Last edited by: Scarlett Presto, RN Scarlett Presto, RN   5-year risk: 0.7 % 0.7 %   Lifetime risk: 10.9 % 11 %

## 2021-02-12 NOTE — Progress Notes (Signed)
Letter mailed from [facility] to notify of normal mammogram results.  Patient to return in one year for annual screening.  Copy to [facility]. 

## 2022-07-30 ENCOUNTER — Ambulatory Visit
Admission: RE | Admit: 2022-07-30 | Discharge: 2022-07-30 | Disposition: A | Discharge status: Home / Self Care | Payer: Self-pay | Source: Ambulatory Visit | Attending: Obstetrics and Gynecology | Admitting: Obstetrics and Gynecology

## 2022-07-30 ENCOUNTER — Other Ambulatory Visit: Payer: Self-pay

## 2022-07-30 ENCOUNTER — Encounter: Payer: Self-pay | Admitting: *Deleted

## 2022-07-30 ENCOUNTER — Ambulatory Visit: Payer: Self-pay | Attending: Hematology and Oncology | Admitting: *Deleted

## 2022-07-30 VITALS — BP 116/85 | Wt 154.8 lb

## 2022-07-30 DIAGNOSIS — Z1231 Encounter for screening mammogram for malignant neoplasm of breast: Secondary | ICD-10-CM

## 2022-07-30 DIAGNOSIS — Z01419 Encounter for gynecological examination (general) (routine) without abnormal findings: Secondary | ICD-10-CM

## 2022-07-30 NOTE — Progress Notes (Signed)
Cathy Owens is a 46 y.o. female who presents to [facility] clinic today with no complaints. Patient presents for clinical breast exam and mammogram.    Pap Smear: Pap not smear completed today. Last Pap smear was on 11/19/18 with Tresea Mall, CNM at [facility] and was  negative / negative . Per patient has no history of an abnormal Pap smear. Last Pap smear result is available in Epic.   Physical exam: Breasts Breasts symmetrical. No skin abnormalities bilateral breasts. No nipple retraction bilateral breasts. No nipple discharge bilateral breasts. No lymphadenopathy. No lumps palpated bilateral breasts.       Pelvic/Bimanual Pap is not indicated today    Smoking History: Patient has never smoked    Patient Navigation: Patient education provided. Taught self awareness.  Access to services provided for patient through [facility] program. No interpreter provided. No transportation provided   Colorectal Cancer Screening: Per patient has never had colonoscopy completed. No complaints today. Patient does not meet screening guidelines based on age.   Breast and Cervical Cancer Risk Assessment: Patient does not have family history of breast cancer, known genetic mutations, or radiation treatment to the chest before age 71. Patient does not have history of cervical dysplasia, immunocompromised, or DES exposure in-utero.  Risk Assessment   No risk assessment data for the current encounter  Risk Scores       02/07/2021   Last edited by: Scarlett Presto, RN   5-year risk: 0.7 %   Lifetime risk: 10.9 %             Risk Assessment     Risk Scores       07/30/2022 02/07/2021   Last edited by: Narda Rutherford, LPN Scarlett Presto, RN   5-year risk: 0.9 % 0.7 %   Lifetime risk: 10.7 % 10.9 %             A: BCCCP exam without pap smear   P: Referred patient to the [facility] for a screening mammogram. Appointment scheduled today.  Will follow up per BCCCP  protocol.  Jim Like, RN 07/30/2022 10:40 AM

## 2023-12-19 ENCOUNTER — Other Ambulatory Visit: Payer: Self-pay

## 2023-12-19 DIAGNOSIS — Z1231 Encounter for screening mammogram for malignant neoplasm of breast: Secondary | ICD-10-CM

## 2024-01-12 ENCOUNTER — Ambulatory Visit: Payer: Medicaid Other

## 2024-01-12 ENCOUNTER — Ambulatory Visit: Payer: Self-pay

## 2024-03-08 ENCOUNTER — Other Ambulatory Visit: Payer: Self-pay

## 2024-03-08 ENCOUNTER — Ambulatory Visit: Payer: Self-pay | Attending: Hematology and Oncology | Admitting: Hematology and Oncology

## 2024-03-08 ENCOUNTER — Ambulatory Visit
Admission: RE | Admit: 2024-03-08 | Discharge: 2024-03-08 | Disposition: A | Discharge status: Home / Self Care | Payer: Self-pay | Source: Ambulatory Visit | Attending: Obstetrics and Gynecology | Admitting: Obstetrics and Gynecology

## 2024-03-08 VITALS — BP 119/78 | Wt 153.0 lb

## 2024-03-08 DIAGNOSIS — Z1231 Encounter for screening mammogram for malignant neoplasm of breast: Secondary | ICD-10-CM | POA: Insufficient documentation

## 2024-03-08 DIAGNOSIS — Z124 Encounter for screening for malignant neoplasm of cervix: Secondary | ICD-10-CM

## 2024-03-08 DIAGNOSIS — Z01419 Encounter for gynecological examination (general) (routine) without abnormal findings: Secondary | ICD-10-CM

## 2024-03-08 DIAGNOSIS — Z1211 Encounter for screening for malignant neoplasm of colon: Secondary | ICD-10-CM

## 2024-03-08 NOTE — Patient Instructions (Signed)
 Taught Bryttney Netzer how to perform BSE and gave educational materials to take home. Patient did not need a Pap smear today due to last Pap smear was in 11/19/2018 per patient. Told patient about free cervical cancer screenings to receive a Pap smear if would like one next year. Let her know BCCCP will cover Pap smears every 5 years unless has a history of abnormal Pap smears. Referred patient to the [facility] for screening mammogram. Appointment scheduled for 03/08/2024. Patient aware of appointment and will be there. Let patient know will follow up with her within the next couple weeks with results. Lorilyn Laitinen verbalized understanding.  Pascal Lux, NP 1:34 PM

## 2024-03-08 NOTE — Progress Notes (Signed)
 Ms. Cathy Owens is a 47 y.o. [id number] female who presents to [facility] clinic today with no complaints.    Pap Smear: Pap smear completed today. Last Pap smear was 11/19/2018 at [facility] clinic and was normal. Per patient has no history of an abnormal Pap smear. Last Pap smear result is available in Epic.   Physical exam: Breasts Breasts symmetrical. No skin abnormalities bilateral breasts. No nipple retraction bilateral breasts. No nipple discharge bilateral breasts. No lymphadenopathy. No lumps palpated bilateral breasts.       Pelvic/Bimanual Ext Genitalia No lesions, no swelling and no discharge observed on external genitalia.        Vagina Vagina pink and normal texture. No lesions or discharge observed in vagina.        Cervix Cervix is present. Cervix pink and of normal texture. No discharge observed.    Uterus Uterus is present and palpable. Uterus in normal position and normal size.        Adnexae Bilateral ovaries present and palpable. No tenderness on palpation.         Rectovaginal No rectal exam completed today since patient had no rectal complaints. No skin abnormalities observed on exam.     Smoking History: Patient has never smoked and was not referred to quit line.    Patient Navigation: Patient education provided. Access to services provided for patient through [facility] program. No interpreter provided. No transportation provided   Colorectal Cancer Screening: Per patient has never had colonoscopy completed No complaints today. FIT test given.   Breast and Cervical Cancer Risk Assessment: Patient does not have family history of breast cancer, known genetic mutations, or radiation treatment to the chest before age 41. Patient does not have history of cervical dysplasia, immunocompromised, or DES exposure in-utero.  Risk Scores as of Encounter on 03/08/2024     Cathy Owens           5-year 1.17%   Lifetime 12.84%            Last calculated by Narda Rutherford, LPN on  8/46/9629 at  1:09 PM        A: BCCCP exam with pap smear No complaints with benign exam.   P: Referred patient to the [facility] [place] for a screening mammogram. Appointment scheduled 03/08/2024.  Ilda Basset A, NP 03/08/2024 1:27 PM

## 2024-03-11 ENCOUNTER — Telehealth: Payer: Self-pay

## 2024-03-11 LAB — CYTOLOGY - PAP
Adequacy: ABSENT
Comment: NEGATIVE
Diagnosis: UNDETERMINED — AB
High risk HPV: NEGATIVE

## 2024-03-11 NOTE — Telephone Encounter (Signed)
 Patient informed pap results ASC-US with negative HPV, needs to repeat pap/hpv in 3 years, discussed with patient. Patient verbalized understanding, stated she started her menstrual cycle 2 days ago.

## 2024-10-20 LAB — COLOGUARD: COLOGUARD: NEGATIVE
